# Patient Record
Sex: Male | Born: 1955 | Race: Black or African American | Hispanic: No | Marital: Married | Smoking: Current every day smoker
Health system: Southern US, Community
[De-identification: ages and names within clinical notes are randomized; demographics above are authoritative.]

## PROBLEM LIST (undated history)

## (undated) ENCOUNTER — Emergency Department (HOSPITAL_COMMUNITY): Admission: EM | Payer: No Typology Code available for payment source

## (undated) DIAGNOSIS — F1910 Other psychoactive substance abuse, uncomplicated: Secondary | ICD-10-CM

## (undated) DIAGNOSIS — R51 Headache: Secondary | ICD-10-CM

## (undated) DIAGNOSIS — G4700 Insomnia, unspecified: Secondary | ICD-10-CM

## (undated) DIAGNOSIS — E119 Type 2 diabetes mellitus without complications: Secondary | ICD-10-CM

## (undated) DIAGNOSIS — E7800 Pure hypercholesterolemia, unspecified: Secondary | ICD-10-CM

## (undated) DIAGNOSIS — F4310 Post-traumatic stress disorder, unspecified: Secondary | ICD-10-CM

## (undated) DIAGNOSIS — R519 Headache, unspecified: Secondary | ICD-10-CM

## (undated) DIAGNOSIS — K219 Gastro-esophageal reflux disease without esophagitis: Secondary | ICD-10-CM

## (undated) DIAGNOSIS — F419 Anxiety disorder, unspecified: Secondary | ICD-10-CM

## (undated) DIAGNOSIS — G43909 Migraine, unspecified, not intractable, without status migrainosus: Secondary | ICD-10-CM

## (undated) DIAGNOSIS — I10 Essential (primary) hypertension: Secondary | ICD-10-CM

## (undated) DIAGNOSIS — F32A Depression, unspecified: Secondary | ICD-10-CM

## (undated) DIAGNOSIS — H9190 Unspecified hearing loss, unspecified ear: Secondary | ICD-10-CM

## (undated) DIAGNOSIS — F329 Major depressive disorder, single episode, unspecified: Secondary | ICD-10-CM

## (undated) DIAGNOSIS — G4730 Sleep apnea, unspecified: Secondary | ICD-10-CM

## (undated) HISTORY — DX: Anxiety disorder, unspecified: F41.9

## (undated) HISTORY — DX: Depression, unspecified: F32.A

## (undated) HISTORY — DX: Headache, unspecified: R51.9

## (undated) HISTORY — DX: Major depressive disorder, single episode, unspecified: F32.9

## (undated) HISTORY — PX: COLON SURGERY: SHX602

## (undated) HISTORY — DX: Insomnia, unspecified: G47.00

## (undated) HISTORY — DX: Sleep apnea, unspecified: G47.30

## (undated) HISTORY — DX: Post-traumatic stress disorder, unspecified: F43.10

## (undated) HISTORY — DX: Type 2 diabetes mellitus without complications: E11.9

## (undated) HISTORY — DX: Migraine, unspecified, not intractable, without status migrainosus: G43.909

## (undated) HISTORY — PX: BRAIN SURGERY: SHX531

## (undated) HISTORY — DX: Headache: R51

## (undated) HISTORY — DX: Unspecified hearing loss, unspecified ear: H91.90

## (undated) HISTORY — PX: COLONOSCOPY: SHX174

## (undated) HISTORY — DX: Other psychoactive substance abuse, uncomplicated: F19.10

---

## 2002-11-05 ENCOUNTER — Emergency Department (HOSPITAL_COMMUNITY): Admission: EM | Admit: 2002-11-05 | Discharge: 2002-11-05 | Payer: Self-pay | Admitting: Emergency Medicine

## 2002-11-05 ENCOUNTER — Encounter: Payer: Self-pay | Admitting: Emergency Medicine

## 2004-08-13 ENCOUNTER — Ambulatory Visit: Payer: Self-pay | Admitting: Internal Medicine

## 2004-08-18 ENCOUNTER — Ambulatory Visit: Payer: Self-pay | Admitting: Family Medicine

## 2004-09-05 ENCOUNTER — Ambulatory Visit: Payer: Self-pay | Admitting: Gastroenterology

## 2004-09-14 DIAGNOSIS — Z86010 Personal history of colonic polyps: Secondary | ICD-10-CM | POA: Insufficient documentation

## 2004-09-19 ENCOUNTER — Ambulatory Visit: Payer: Self-pay | Admitting: Gastroenterology

## 2005-11-23 ENCOUNTER — Ambulatory Visit: Payer: Self-pay | Admitting: Internal Medicine

## 2006-01-15 ENCOUNTER — Ambulatory Visit: Payer: Self-pay | Admitting: Internal Medicine

## 2015-07-02 ENCOUNTER — Emergency Department (INDEPENDENT_AMBULATORY_CARE_PROVIDER_SITE_OTHER)
Admission: EM | Admit: 2015-07-02 | Discharge: 2015-07-02 | Disposition: A | Discharge status: Home / Self Care | Payer: Self-pay | Source: Home / Self Care | Attending: Emergency Medicine | Admitting: Emergency Medicine

## 2015-07-02 ENCOUNTER — Encounter (HOSPITAL_COMMUNITY): Payer: Self-pay | Admitting: Emergency Medicine

## 2015-07-02 DIAGNOSIS — K1120 Sialoadenitis, unspecified: Secondary | ICD-10-CM

## 2015-07-02 DIAGNOSIS — S0083XA Contusion of other part of head, initial encounter: Secondary | ICD-10-CM

## 2015-07-02 DIAGNOSIS — T148 Other injury of unspecified body region: Secondary | ICD-10-CM

## 2015-07-02 DIAGNOSIS — T148XXA Other injury of unspecified body region, initial encounter: Secondary | ICD-10-CM

## 2015-07-02 DIAGNOSIS — M545 Low back pain, unspecified: Secondary | ICD-10-CM

## 2015-07-02 DIAGNOSIS — R51 Headache: Secondary | ICD-10-CM

## 2015-07-02 DIAGNOSIS — R519 Headache, unspecified: Secondary | ICD-10-CM

## 2015-07-02 DIAGNOSIS — S39012A Strain of muscle, fascia and tendon of lower back, initial encounter: Secondary | ICD-10-CM

## 2015-07-02 MED ORDER — AMOXICILLIN-POT CLAVULANATE 875-125 MG PO TABS: 1.0000 | ORAL_TABLET | Freq: Two times a day (BID) | ORAL | Status: DC

## 2015-07-02 MED ORDER — NAPROXEN 375 MG PO TABS: 375.0000 mg | ORAL_TABLET | Freq: Two times a day (BID) | ORAL | Status: DC
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
## 2015-10-05 ENCOUNTER — Emergency Department (INDEPENDENT_AMBULATORY_CARE_PROVIDER_SITE_OTHER)
Admission: EM | Admit: 2015-10-05 | Discharge: 2015-10-05 | Disposition: A | Discharge status: Home / Self Care | Payer: Self-pay | Source: Home / Self Care

## 2015-10-05 ENCOUNTER — Encounter (HOSPITAL_COMMUNITY): Payer: Self-pay | Admitting: *Deleted

## 2015-10-05 DIAGNOSIS — K0889 Other specified disorders of teeth and supporting structures: Secondary | ICD-10-CM

## 2015-10-05 MED ORDER — KETOROLAC TROMETHAMINE 60 MG/2ML IM SOLN
INTRAMUSCULAR | Status: AC
  Filled 2015-10-05: qty 2

## 2015-10-05 MED ORDER — KETOROLAC TROMETHAMINE 60 MG/2ML IM SOLN
60.0000 mg | Freq: Once | INTRAMUSCULAR | Status: AC
  Administered 2015-10-05: 60 mg via INTRAMUSCULAR

## 2015-10-05 MED ORDER — OXYCODONE-ACETAMINOPHEN 5-325 MG PO TABS: 1.0000 | ORAL_TABLET | ORAL | Status: DC | PRN

## 2015-10-05 MED ORDER — AMOXICILLIN-POT CLAVULANATE 875-125 MG PO TABS: 1.0000 | ORAL_TABLET | Freq: Two times a day (BID) | ORAL | Status: DC

## 2015-10-05 NOTE — Discharge Instructions (Signed)
An injection of toradol (ketorolac, an anti inflammatory) was given today for pain. Followup with dentist as soon as possible about pain in jaw after recent tooth extractions. Prescriptions for augmentin (antibiotic) and a small number (#10) of percocet were given today.

## 2015-10-05 NOTE — ED Provider Notes (Signed)
CSN: [id number]     Arrival date & time 10/05/15  1916 History   None    Chief Complaint  Patient presents with  . Jaw Pain   HPI  Patient is a 60 year old gentleman who had left upper and left lower dental extractions about a week ago, now persistent pain in L upper jaw. No fever.    History reviewed. No pertinent past medical history. History reviewed. No pertinent past surgical history. History reviewed. No pertinent family history. Social History  Substance Use Topics  . Smoking status: Current Every Day Smoker    Types: Cigarettes  . Smokeless tobacco: None  . Alcohol Use: No    Review of Systems  All other systems reviewed and are negative.   Allergies  Review of patient's allergies indicates no known allergies.  Home Medications   Prior to Admission medications   Medication Sig Start Date End Date Taking? Authorizing Provider  amoxicillin-clavulanate (AUGMENTIN) 875-125 MG tablet Take 1 tablet by mouth every 12 (twelve) hours. 10/05/15   Eustace Moore, MD  naproxen (NAPROSYN) 375 MG tablet Take 1 tablet (375 mg total) by mouth 2 (two) times daily. 07/02/15   Hayden Rasmussen, NP  oxyCODONE-acetaminophen (PERCOCET/ROXICET) 5-325 MG tablet Take 1 tablet by mouth every 4 (four) hours as needed for severe pain. 10/05/15   Eustace Moore, MD   Meds Ordered and Administered this Visit   Medications  ketorolac (TORADOL) injection 60 mg (60 mg Intramuscular Given 10/05/15 2020)    BP 134/96 mmHg  Pulse 108  Temp(Src) 99.6 F (37.6 C) (Oral)  SpO2 99%   Physical Exam  Constitutional: He is oriented to person, place, and time. No distress.  Alert, nicely groomed  HENT:  Head: Atraumatic.  Extraction sites L upper molar: gums slightly swollen, no drainage; L lower molar, non inflamed gums.  No trismus. Increased pain after opening/closing mouth.  Face is not swollen.  No tenderness at/behind angle of jaw.    Eyes:  Conjugate gaze, no eye redness/drainage  Neck: Neck  supple.  Cardiovascular: Normal rate.   Pulmonary/Chest: No respiratory distress.  Abdominal: He exhibits no distension.  Musculoskeletal: Normal range of motion.  Neurological: He is alert and oriented to person, place, and time.  Skin: Skin is warm and dry.  No cyanosis  Nursing note and vitals reviewed.   ED Course  Procedures (including critical care time)   none   MDM   1. Pain, dental    Discharge Medication List as of 10/05/2015  8:20 PM    START taking these medications   Details  oxyCODONE-acetaminophen (PERCOCET/ROXICET) 5-325 MG tablet Take 1 tablet by mouth every 4 (four) hours as needed for severe pain., Starting 10/05/2015, Until Discontinued, Print       Rx for augmentin also given. Followup dentist asap.   Eustace Moore, MD 10/06/15 [phone]

## 2015-10-05 NOTE — ED Notes (Signed)
Pt  Reports  Symptoms  Of  l upper jaw  Pain         For  About  1   Week      Pt  Reports   Ad  2  Teeth  Extracted   8  Days  Ago        He  Reports  Pain when he  Opens  His  Mouth  And   On palpation

## 2016-06-08 ENCOUNTER — Ambulatory Visit (HOSPITAL_COMMUNITY)
Admission: EM | Admit: 2016-06-08 | Discharge: 2016-06-08 | Disposition: A | Discharge status: Home / Self Care | Payer: Self-pay | Attending: Family Medicine | Admitting: Family Medicine

## 2016-06-08 ENCOUNTER — Encounter (HOSPITAL_COMMUNITY): Payer: Self-pay | Admitting: Emergency Medicine

## 2016-06-08 DIAGNOSIS — M26622 Arthralgia of left temporomandibular joint: Secondary | ICD-10-CM

## 2016-06-08 HISTORY — DX: Gastro-esophageal reflux disease without esophagitis: K21.9

## 2016-06-08 HISTORY — DX: Pure hypercholesterolemia, unspecified: E78.00

## 2016-06-08 HISTORY — DX: Essential (primary) hypertension: I10

## 2016-06-08 MED ORDER — OXYCODONE-ACETAMINOPHEN 10-325 MG PO TABS: 1.0000 | ORAL_TABLET | ORAL | 0 refills | Status: DC | PRN

## 2016-06-08 NOTE — Discharge Instructions (Signed)
You may need to have a bite block built by a dentist or oral surgeon.   You may also want to use heat or cold to your jaw to help with pain.

## 2016-06-08 NOTE — ED Provider Notes (Signed)
CSN: [id number]     Arrival date & time 06/08/16  1535 History   First MD Initiated Contact with Patient 06/08/16 1635     Chief Complaint  Patient presents with  . Dental Pain   (Consider location/radiation/quality/duration/timing/severity/associated sxs/prior Treatment) HPI History obtained from patient:  Pt presents with the cc of:  Mouth pain Duration of symptoms: Ongoing for over a year Treatment prior to arrival: Has seen multiple providers for this pain but has not come to any good cause of pain. Context: Onset of pain that was described as spasm in his left jaw that comes and goes without warning. Patient states that he has been to see a dentist, urgent care, his doctor at the [place] and no one has ever given him a diagnosis. Other symptoms include: Pain to eat. Pain score: 8-10 FAMILY HISTORY: Hypertension    Past Medical History:  Diagnosis Date  . GERD (gastroesophageal reflux disease)   . Hypercholesteremia   . Hypertension    History reviewed. No pertinent surgical history. History reviewed. No pertinent family history. Social History  Substance Use Topics  . Smoking status: Current Every Day Smoker    Types: Cigarettes  . Smokeless tobacco: Never Used  . Alcohol use No    Review of Systems  Denies: HEADACHE, NAUSEA, ABDOMINAL PAIN, CHEST PAIN, CONGESTION, DYSURIA, SHORTNESS OF BREATH  Allergies  Review of patient's allergies indicates no known allergies.  Home Medications   Prior to Admission medications   Medication Sig Start Date End Date Taking? Authorizing Provider  aspirin EC 81 MG tablet Take 81 mg by mouth daily.   Yes Historical Provider, MD  atorvastatin (LIPITOR) 20 MG tablet Take 20 mg by mouth daily.   Yes Historical Provider, MD  buPROPion (ZYBAN) 150 MG 12 hr tablet Take 150 mg by mouth 2 (two) times daily.   Yes Historical Provider, MD  gabapentin (NEURONTIN) 300 MG capsule Take 300 mg by mouth 2 (two) times daily.   Yes Historical Provider,  MD  hydrochlorothiazide (HYDRODIURIL) 25 MG tablet Take 25 mg by mouth daily.   Yes Historical Provider, MD  lidocaine (XYLOCAINE) 2 % solution Use as directed 20 mLs in the mouth or throat as needed for mouth pain.   Yes Historical Provider, MD  lisinopril (PRINIVIL,ZESTRIL) 40 MG tablet Take 40 mg by mouth daily.   Yes Historical Provider, MD  loratadine (CLARITIN REDITABS) 10 MG dissolvable tablet Take 10 mg by mouth daily.   Yes Historical Provider, MD  mirtazapine (REMERON) 30 MG tablet Take 30 mg by mouth at bedtime.   Yes Historical Provider, MD  nicotine polacrilex (NICORETTE) 4 MG gum Take 4 mg by mouth as needed for smoking cessation.   Yes Historical Provider, MD  nortriptyline (PAMELOR) 25 MG capsule Take 25 mg by mouth at bedtime.   Yes Historical Provider, MD  omeprazole (PRILOSEC) 20 MG capsule Take 20 mg by mouth daily.   Yes Historical Provider, MD  Sennosides 8.6 MG CAPS Take by mouth.   Yes Historical Provider, MD  amoxicillin-clavulanate (AUGMENTIN) 875-125 MG tablet Take 1 tablet by mouth every 12 (twelve) hours. 10/05/15   Eustace Moore, MD  naproxen (NAPROSYN) 375 MG tablet Take 1 tablet (375 mg total) by mouth 2 (two) times daily. 07/02/15   Hayden Rasmussen, NP  oxyCODONE-acetaminophen (PERCOCET) 10-325 MG tablet Take 1 tablet by mouth every 4 (four) hours as needed for pain. 06/08/16   Tharon Aquas, PA  oxyCODONE-acetaminophen (PERCOCET/ROXICET) 5-325 MG tablet Take 1  tablet by mouth every 4 (four) hours as needed for severe pain. 10/05/15   Eustace MooreLaura W Murray, MD   Meds Ordered and Administered this Visit  Medications - No data to display  BP 128/95 (BP Location: Left Arm)   Pulse 75   Temp 98.7 F (37.1 C) (Oral)   Resp 12   SpO2 100%  No data found.   Physical Exam NURSES NOTES AND VITAL SIGNS REVIEWED. CONSTITUTIONAL: Well developed, well nourished, no acute distress HEENT: normocephalic, atraumatic, there is tenderness noted over the left TMJ. There is clicking  noted. The TMJ does not feel dislocated. Examination of the mouth reveals teeth in poor repair. EYES: Conjunctiva normal NECK:normal ROM, supple, no adenopathy PULMONARY:No respiratory distress, normal effort ABDOMINAL: Soft, ND, NT BS+, No CVAT MUSCULOSKELETAL: Normal ROM of all extremities,  SKIN: warm and dry without rash PSYCHIATRIC: Mood and affect, behavior are normal  Urgent Care Course   Clinical Course    Procedures (including critical care time)  Labs Review Labs Reviewed - No data to display  Imaging Review No results found.   Visual Acuity Review  Right Eye Distance:   Left Eye Distance:   Bilateral Distance:    Right Eye Near:   Left Eye Near:    Bilateral Near:         MDM   1. Arthralgia of left temporomandibular joint     Patient is reassured that there are no issues that require transfer to higher level of care at this time or additional tests. Patient is advised to continue home symptomatic treatment. Patient is advised that if there are new or worsening symptoms to attend the emergency department, contact primary care provider, or return to UC. Instructions of care provided discharged home in stable condition.    THIS NOTE WAS GENERATED USING A VOICE RECOGNITION SOFTWARE PROGRAM. ALL REASONABLE EFFORTS  WERE MADE TO PROOFREAD THIS DOCUMENT FOR ACCURACY.  I have verbally reviewed the discharge instructions with the patient. A printed AVS was given to the patient.  All questions were answered prior to discharge.      Tharon AquasFrank C Symphani Eckstrom, PA 06/08/16 1754

## 2016-06-08 NOTE — ED Triage Notes (Signed)
[facility] with a complaint of pain to the top pallet of his mouth x 1 week.

## 2017-01-26 ENCOUNTER — Ambulatory Visit: Payer: Self-pay | Admitting: Diagnostic Neuroimaging

## 2017-01-27 ENCOUNTER — Encounter: Payer: Self-pay | Admitting: Diagnostic Neuroimaging

## 2017-02-02 ENCOUNTER — Encounter (INDEPENDENT_AMBULATORY_CARE_PROVIDER_SITE_OTHER): Payer: Self-pay

## 2017-02-02 ENCOUNTER — Encounter: Payer: Self-pay | Admitting: Neurology

## 2017-02-02 ENCOUNTER — Ambulatory Visit (INDEPENDENT_AMBULATORY_CARE_PROVIDER_SITE_OTHER): Payer: Self-pay | Admitting: Neurology

## 2017-02-02 DIAGNOSIS — G501 Atypical facial pain: Secondary | ICD-10-CM

## 2017-02-02 MED ORDER — NORTRIPTYLINE HCL 25 MG PO CAPS
75.0000 mg | ORAL_CAPSULE | Freq: Every day | ORAL | 11 refills | Status: DC
Start: 2017-02-02 — End: 2022-01-28

## 2017-02-02 MED ORDER — GABAPENTIN 300 MG PO CAPS: 600.0000 mg | ORAL_CAPSULE | Freq: Three times a day (TID) | ORAL | 6 refills | Status: DC

## 2017-02-02 NOTE — Progress Notes (Signed)
PATIENT: Reginald Burke DOB: 09/27/1955  Chief Complaint  Patient presents with  . Trigeminal Neuralgia    Reports left-sided facial pain present since November 2016.  Says the pain is severe and constant.  His symptoms have been unrelieved by Tylenol, nortriptyline, gabapentin or oxycodone.  He needs prescription printed so he can [facility] [place].  . Oral Surgery    Best, Viviann SpareSteven, DMD - referring MD  . PCP    Cleta AlbertsMulles, Corazon, MD     HISTORICAL  Reginald Coseginald L Tidmore 61 years old right-handed male, seen in refer by dentist Nelda MarseilleBest, Steven, DMD for left facial pain, [facility] [address] Marko St[place]ulles, [place], initial evaluation was on Feb 02 2017  He had a history of hypertension, hyperlipidemia, PTSD, is taking buPropion 150 mg twice a day, and nortriptyline 25 mg a day, he is now taking gabapentin 300 mg twice a day since beginning of 2018,  he had a history of colectomy due to multiple colon polyps in 2013, he is on disability due to PTSD.  Have reviewed and summarized referring note from his dentist Dr. Ellery PlunkBest, he had left maxillary second premolar and left lower molar extrated in November 2016, he had tooth pain prior to the extraction, few days after the extraction, he has developed different kind of pain, throbbing pain at the left upper and lower jaws, radiating from the center, which has been persistent since then, pains were intermittent, but very intense, stop him in the middle of the sentence, triggered by touching his left face, chewing, talking, sometimes spreading to the left upper mouth roof, he denies significant weight loss, over the past 2 years, he has tried different medications, this including over-the-counter Tylenol, ibuprofen, prescription oxycodone, without helping his pain, he was told at one time that he had TMJ,  Patient had multiple imaging study including left jaw area [facility] [place], per  patient there was no significant abnormality found  He is now taking gabapentin 300 mg at 8 AM, 8 PM, complains of mild sleepiness, which does help his pain some, but sometimes he has such significant frequent pain, like last night, despite gabapentin and nortriptyline 25 mg 2 tablets every night, complains of difficulty sleeping.  REVIEW OF SYSTEMS: Full 14 system review of systems performed and notable only for hearing loss, feeling hot, cold, joint pain, depression anxiety not enough sleep, decreased energy disinteresting activities, racing thoughts.  ALLERGIES: No Known Allergies  HOME MEDICATIONS: Current Outpatient Prescriptions  Medication Sig Dispense Refill  . aspirin EC 81 MG tablet Take 81 mg by mouth daily.    Marland Kitchen. atorvastatin (LIPITOR) 20 MG tablet Take 20 mg by mouth daily.    Marland Kitchen. buPROPion (ZYBAN) 150 MG 12 hr tablet Take 150 mg by mouth 2 (two) times daily.    Marland Kitchen. gabapentin (NEURONTIN) 300 MG capsule Take 300 mg by mouth 2 (two) times daily.    . hydrochlorothiazide (HYDRODIURIL) 25 MG tablet Take 25 mg by mouth daily.    Marland Kitchen. lidocaine (XYLOCAINE) 2 % solution Use as directed 20 mLs in the mouth or throat as needed for mouth pain.    Marland Kitchen. lisinopril (PRINIVIL,ZESTRIL) 40 MG tablet Take 40 mg by mouth daily.    Marland Kitchen. loratadine (CLARITIN REDITABS) 10 MG dissolvable tablet Take 10 mg by mouth daily.    . mirtazapine (REMERON) 30 MG tablet Take 30 mg by mouth at bedtime.    .Marland Kitchen  naproxen (NAPROSYN) 375 MG tablet Take 1 tablet (375 mg total) by mouth 2 (two) times daily. 20 tablet 0  . nortriptyline (PAMELOR) 25 MG capsule Take 25 mg by mouth at bedtime.    Marland Kitchen omeprazole (PRILOSEC) 20 MG capsule Take 20 mg by mouth daily.    . Sennosides 8.6 MG CAPS Take by mouth.     No current facility-administered medications for this visit.     PAST MEDICAL HISTORY: Past Medical History:  Diagnosis Date  . Anxiety and depression   . GERD (gastroesophageal reflux disease)   . Hypercholesteremia   .  Hypertension   . Left facial pain   . Migraine   . PTSD (post-traumatic stress disorder)     PAST SURGICAL HISTORY: Past Surgical History:  Procedure Laterality Date  . COLON SURGERY      FAMILY HISTORY: Family History  Problem Relation Age of Onset  . Kidney failure Mother   . Prostate cancer Father     SOCIAL HISTORY:  Social History   Social History  . Marital status: Married    Spouse name: N/A  . Number of children: 2  . Years of education: HS   Occupational History  . Disabled    Social History Main Topics  . Smoking status: Current Every Day Smoker    Packs/day: 0.50    Types: Cigarettes  . Smokeless tobacco: Never Used  . Alcohol use No  . Drug use: No  . Sexual activity: Not on file   Other Topics Concern  . Not on file   Social History Narrative   Lives at home with wife and two sons.   Right-handed.   3 cups caffeine per day.     PHYSICAL EXAM   Vitals:   02/02/17 1126  BP: (!) 139/109  Pulse: 94  Weight: 179 lb 12 oz (81.5 kg)  Height: 5\' 11"  (1.803 m)    Not recorded      Body mass index is 25.07 kg/m.  PHYSICAL EXAMNIATION:  Gen: NAD, conversant, well nourised, obese, well groomed                     Cardiovascular: Regular rate rhythm, no peripheral edema, warm, nontender. Eyes: Conjunctivae clear without exudates or hemorrhage Neck: Supple, no carotid bruits. Pulmonary: Clear to auscultation bilaterally   NEUROLOGICAL EXAM:  MENTAL STATUS: Speech:    Speech is normal; fluent and spontaneous with normal comprehension.  Cognition:     Orientation to time, place and person     Normal recent and remote memory     Normal Attention span and concentration     Normal Language, naming, repeating,spontaneous speech     Fund of knowledge   CRANIAL NERVES: CN II: Visual fields are full to confrontation. Fundoscopic exam is normal with sharp discs and no vascular changes. Pupils are round equal and briskly reactive to  light. CN III, IV, VI: extraocular movement are normal. No ptosis. CN V: Facial sensation is intact to pinprick in all 3 divisions bilaterally. Corneal responses are intact.  CN VII: Face is symmetric with normal eye closure and smile. CN VIII: Hearing is normal to rubbing fingers CN IX, X: Palate elevates symmetrically. Phonation is normal. CN XI: Head turning and shoulder shrug are intact CN XII: Tongue is midline with normal movements and no atrophy.  MOTOR: There is no pronator drift of out-stretched arms. Muscle bulk and tone are normal. Muscle strength is normal.  REFLEXES: Reflexes are 2+  and symmetric at the biceps, triceps, knees, and ankles. Plantar responses are flexor.  SENSORY: Intact to light touch, pinprick, positional sensation and vibratory sensation are intact in fingers and toes.  COORDINATION: Rapid alternating movements and fine finger movements are intact. There is no dysmetria on finger-to-nose and heel-knee-shin.    GAIT/STANCE: Posture is normal. Gait is steady with normal steps, base, arm swing, and turning. Heel and toe walking are normal. Tandem gait is normal.  Romberg is absent.   DIAGNOSTIC DATA (LABS, IMAGING, TESTING) - I reviewed patient records, labs, notes, testing and imaging myself where available.   ASSESSMENT AND PLAN  COBIE MARCOUX is a 61 y.o. male   Atypical left facial pain, involving left V2, V3 branches,  Get [facility]  Increase gabapentin to 300 mg 2 tablets 3 times a day, increase nortriptyline to 25 mg 3 tablets every night   Levert Feinstein, M.D. Ph.D.  [facility] [address] [place], [place] [address] Ph: [phone] Fax: [phone]  CC:  Nelda Marseille, DMD, Cleta Alberts, MD

## 2017-03-01 ENCOUNTER — Telehealth: Payer: Self-pay | Admitting: Neurology

## 2017-03-01 ENCOUNTER — Encounter: Payer: Self-pay | Admitting: Neurology

## 2017-03-01 ENCOUNTER — Ambulatory Visit (INDEPENDENT_AMBULATORY_CARE_PROVIDER_SITE_OTHER): Payer: Self-pay | Admitting: Neurology

## 2017-03-01 VITALS — BP 144/89 | HR 86 | Ht 71.0 in | Wt 185.0 lb

## 2017-03-01 DIAGNOSIS — G501 Atypical facial pain: Secondary | ICD-10-CM

## 2017-03-01 MED ORDER — OXCARBAZEPINE 150 MG PO TABS: 300.0000 mg | ORAL_TABLET | Freq: Two times a day (BID) | ORAL | 11 refills | Status: DC

## 2017-03-01 MED ORDER — PREGABALIN 150 MG PO CAPS: 150.0000 mg | ORAL_CAPSULE | Freq: Three times a day (TID) | ORAL | 5 refills | Status: DC

## 2017-03-01 NOTE — Progress Notes (Signed)
PATIENT: Reginald Burke DOB: 09/14/1955  Chief Complaint  Patient presents with  . Follow-up    Atypical facial pain follow up pt states he is having facial spams over his face     HISTORICAL  Reginald CosReginald L Wille 61 years old right-handed male, seen in refer by dentist Best, Viviann SpareSteven, DMD for left facial pain, [facility] [address] Marko St[place]ulles, [place], initial evaluation was on Feb 02 2017  He had a history of hypertension, hyperlipidemia, PTSD, is taking buPropion 150 mg twice a day, and nortriptyline 25 mg a day, he is now taking gabapentin 300 mg twice a day since beginning of 2018,  he had a history of colectomy due to multiple colon polyps in 2013, he is on disability due to PTSD.  Have reviewed and summarized referring note from his dentist Dr. Ellery PlunkBest, he had left maxillary second premolar and left lower molar extrated in November 2016, he had tooth pain prior to the extraction, few days after the extraction, he has developed different kind of pain, throbbing pain at the left upper and lower jaws, radiating from the center, which has been persistent since then, pains were intermittent, but very intense, stop him in the middle of the sentence, triggered by touching his left face, chewing, talking, sometimes spreading to the left upper mouth roof, he denies significant weight loss, over the past 2 years, he has tried different medications, this including over-the-counter Tylenol, ibuprofen, prescription oxycodone, without helping his pain, he was told at one time that he had TMJ,  Patient had multiple imaging study [facility], per patient there was no significant abnormality found  He is now taking gabapentin 300 mg at 8 AM, 8 PM, complains of mild sleepiness, which does help his pain some, but sometimes he has such significant frequent pain, like last night, despite gabapentin and nortriptyline 25 mg 2 tablets every  night, complains of difficulty sleeping.  UPDATE March 01 2017: He is scheduled to have MRI of the brain with and without contrast [facility] on March 09 2017,  He is now taking gabapentin 300 mg 2 tablets 3 times a day, only help him mildly  He complains of severe intermittent left facial pain, REVIEW OF SYSTEMS: Full 14 system review of systems performed and notable only for activity change, appetite change, fatigue, facial swelling, hearing loss, ringing ears, trouble swallowing, eye discharge, itching, redness, light sensitivity, blurry vision cough, insomnia, frequent awakening, daytime sleepiness, sleep talking, acting out of James, frequent urination, joints pain, back pain, muscle cramps, walking difficulty, neck pain, stiffness, memory loss, swallowing needing for note, dizziness, numbness, seizure speech difficulty, weakness, tremor, facial drooping, agitation behavior change confusion decreased concentration depression anxiety hyperactivity  ALLERGIES: No Known Allergies  HOME MEDICATIONS: Current Outpatient Prescriptions  Medication Sig Dispense Refill  . aspirin EC 81 MG tablet Take 81 mg by mouth daily.    Marland Kitchen. atorvastatin (LIPITOR) 20 MG tablet Take 20 mg by mouth daily.    Marland Kitchen. buPROPion (ZYBAN) 150 MG 12 hr tablet Take 150 mg by mouth 2 (two) times daily.    Marland Kitchen. gabapentin (NEURONTIN) 300 MG capsule Take 2 capsules (600 mg total) by mouth 3 (three) times daily. 180 capsule 6  . hydrochlorothiazide (HYDRODIURIL) 25 MG tablet Take 25 mg by mouth daily.    Marland Kitchen. lidocaine (XYLOCAINE) 2 % solution Use as directed 20 mLs in the mouth or throat as  needed for mouth pain.    Marland Kitchen lisinopril (PRINIVIL,ZESTRIL) 40 MG tablet Take 40 mg by mouth daily.    Marland Kitchen loratadine (CLARITIN REDITABS) 10 MG dissolvable tablet Take 10 mg by mouth daily.    . mirtazapine (REMERON) 30 MG tablet Take 30 mg by mouth at bedtime.    . naproxen (NAPROSYN) 375 MG tablet Take 1 tablet (375 mg total) by mouth 2 (two)  times daily. 20 tablet 0  . nortriptyline (PAMELOR) 25 MG capsule Take 3 capsules (75 mg total) by mouth at bedtime. 90 capsule 11  . omeprazole (PRILOSEC) 20 MG capsule Take 20 mg by mouth daily.    . Sennosides 8.6 MG CAPS Take by mouth.     No current facility-administered medications for this visit.     PAST MEDICAL HISTORY: Past Medical History:  Diagnosis Date  . Anxiety and depression   . GERD (gastroesophageal reflux disease)   . Hypercholesteremia   . Hypertension   . Left facial pain   . Migraine   . PTSD (post-traumatic stress disorder)     PAST SURGICAL HISTORY: Past Surgical History:  Procedure Laterality Date  . COLON SURGERY      FAMILY HISTORY: Family History  Problem Relation Age of Onset  . Kidney failure Mother   . Prostate cancer Father     SOCIAL HISTORY:  Social History   Social History  . Marital status: Married    Spouse name: N/A  . Number of children: 2  . Years of education: HS   Occupational History  . Disabled    Social History Main Topics  . Smoking status: Current Every Day Smoker    Packs/day: 0.50    Types: Cigarettes  . Smokeless tobacco: Never Used  . Alcohol use No  . Drug use: No  . Sexual activity: Not on file   Other Topics Concern  . Not on file   Social History Narrative   Lives at home with wife and two sons.   Right-handed.   3 cups caffeine per day.     PHYSICAL EXAM   Vitals:   03/01/17 1143  BP: (!) 144/89  Pulse: 86  Weight: 185 lb (83.9 kg)  Height: 5\' 11"  (1.803 m)    Not recorded      Body mass index is 25.8 kg/m.  PHYSICAL EXAMNIATION:  Gen: NAD, conversant, well nourised, obese, well groomed                     Cardiovascular: Regular rate rhythm, no peripheral edema, warm, nontender. Eyes: Conjunctivae clear without exudates or hemorrhage Neck: Supple, no carotid bruits. Pulmonary: Clear to auscultation bilaterally   NEUROLOGICAL EXAM:  MENTAL STATUS: Speech:    Speech is  normal; fluent and spontaneous with normal comprehension.  Cognition:     Orientation to time, place and person     Normal recent and remote memory     Normal Attention span and concentration     Normal Language, naming, repeating,spontaneous speech     Fund of knowledge   CRANIAL NERVES: CN II: Visual fields are full to confrontation. Fundoscopic exam is normal with sharp discs and no vascular changes. Pupils are round equal and briskly reactive to light. CN III, IV, VI: extraocular movement are normal. No ptosis. CN V: Facial sensation is intact to pinprick in all 3 divisions bilaterally. Corneal responses are intact.  CN VII: Face is symmetric with normal eye closure and smile. CN VIII: Hearing is  normal to rubbing fingers CN IX, X: Palate elevates symmetrically. Phonation is normal. CN XI: Head turning and shoulder shrug are intact CN XII: Tongue is midline with normal movements and no atrophy.  MOTOR: There is no pronator drift of out-stretched arms. Muscle bulk and tone are normal. Muscle strength is normal.  REFLEXES: Reflexes are 2+ and symmetric at the biceps, triceps, knees, and ankles. Plantar responses are flexor.  SENSORY: Intact to light touch, pinprick, positional sensation and vibratory sensation are intact in fingers and toes.  COORDINATION: Rapid alternating movements and fine finger movements are intact. There is no dysmetria on finger-to-nose and heel-knee-shin.    GAIT/STANCE: Posture is normal. Gait is steady with normal steps, base, arm swing, and turning. Heel and toe walking are normal. Tandem gait is normal.  Romberg is absent.   DIAGNOSTIC DATA (LABS, IMAGING, TESTING) - I reviewed patient records, labs, notes, testing and imaging myself where available.   ASSESSMENT AND PLAN  DELSHAWN STECH is a 61 y.o. male   Atypical left facial pain, involving left V2, V3 branches,  Get medical [facility] from [facility]  MRI brain w/wo  pending March 09 2017 at [facility], will get CD to review  He failed to response to gabapentin 300 mg 2 tablets 3 times a day, continue 10 out of 10 pain, difficulty sleeping, eating  Add on Trileptal 150 mg 2 tablets twice a day,  Lyrica 150mg  tid, stop gabapentin    Levert Feinstein, M.D. Ph.D.  [facility] [address], [address] [place], [place] [address] Ph: [phone] Fax: [phone]  CC:  Nelda Marseille, DMD, Cleta Alberts, MD

## 2017-03-01 NOTE — Telephone Encounter (Signed)
Please get medical record from [facility], including MRI of the brain scan that is scheduled on March 09 2017

## 2017-04-12 ENCOUNTER — Encounter (INDEPENDENT_AMBULATORY_CARE_PROVIDER_SITE_OTHER): Payer: Self-pay

## 2017-04-12 ENCOUNTER — Encounter: Payer: Self-pay | Admitting: Neurology

## 2017-04-12 ENCOUNTER — Ambulatory Visit (INDEPENDENT_AMBULATORY_CARE_PROVIDER_SITE_OTHER): Payer: Self-pay | Admitting: Neurology

## 2017-04-12 VITALS — BP 143/103 | HR 112 | Ht 71.0 in | Wt 186.0 lb

## 2017-04-12 DIAGNOSIS — G501 Atypical facial pain: Secondary | ICD-10-CM

## 2017-04-12 MED ORDER — PREGABALIN 300 MG PO CAPS: 300.0000 mg | ORAL_CAPSULE | Freq: Three times a day (TID) | ORAL | 5 refills | Status: DC

## 2017-04-12 MED ORDER — OXCARBAZEPINE 300 MG PO TABS: 300.0000 mg | ORAL_TABLET | Freq: Two times a day (BID) | ORAL | 11 refills | Status: DC

## 2017-04-12 NOTE — Progress Notes (Signed)
PATIENT: Reginald Burke DOB: 10/23/1955  Chief Complaint  Patient presents with  . Facial Pain    Feels Trileptal and Lyrica have been mildly helpful for his symptoms but he is still having significant pain.  He would like to review his MRI results.     HISTORICAL  Reginald Burke 61 years old right-handed male, seen in refer by dentist Best, Viviann SpareSteven, DMD for left facial pain, [facility] [address] Marko Stai[place]ulles, [place], initial evaluation was on Feb 02 2017  He had a history of hypertension, hyperlipidemia, PTSD, is taking buPropion 150 mg twice a day, and nortriptyline 25 mg a day, he is now taking gabapentin 300 mg twice a day since beginning of 2018,  he had a history of colectomy due to multiple colon polyps in 2013, he is on disability due to PTSD.  Have reviewed and summarized referring note from his dentist Dr. Ellery PlunkBest, he had left maxillary second premolar and left lower molar extrated in November 2016, he had tooth pain prior to the extraction, few days after the extraction, he has developed different kind of pain, throbbing pain at the left upper and lower jaws, radiating from the center, which has been persistent since then, pains were intermittent, but very intense, stop him in the middle of the sentence, triggered by touching his left face, chewing, talking, sometimes spreading to the left upper mouth roof, he denies significant weight loss, over the past 2 years, he has tried different medications, this including over-the-counter Tylenol, ibuprofen, prescription oxycodone, without helping his pain, he was told at one time that he had TMJ,  Patient had multiple imaging [facility], per patient there was no significant abnormality found  He is now taking gabapentin 300 mg at 8 AM, 8 PM, complains of mild sleepiness, which does help his pain some, but sometimes he has such significant frequent pain,  like last night, despite gabapentin and nortriptyline 25 mg 2 tablets every night, complains of difficulty sleeping.  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, there was a description of tortous right intradural vertebral artery, that contact and deform right bulb, and across the midline to the left to join left vertebral pot artery, basilar arteries also to the midline to the right, normal enhancement, He continue complains of significant pain despite taking Lyrica 150 mg 3 times a day, Trileptal 150 mg twice a day, he complains of worsening left jaw pain with chewing,  There was no significant side effect noticed, which did help his symptoms some,  REVIEW OF SYSTEMS: Full 14 system review of systems performed and notable only for as above  ALLERGIES: No Known Allergies  HOME MEDICATIONS: Current Outpatient Prescriptions  Medication Sig Dispense Refill  . aspirin EC 81 MG tablet Take 81 mg by mouth daily.    Marland Kitchen. atorvastatin (LIPITOR) 20 MG tablet Take 20 mg by mouth daily.    Marland Kitchen. buPROPion (ZYBAN) 150 MG 12 hr tablet Take 150 mg by mouth  2 (two) times daily.    . hydrochlorothiazide (HYDRODIURIL) 25 MG tablet Take 25 mg by mouth daily.    Marland Kitchen lidocaine (XYLOCAINE) 2 % solution Use as directed 20 mLs in the mouth or throat as needed for mouth pain.    Marland Kitchen lisinopril (PRINIVIL,ZESTRIL) 40 MG tablet Take 40 mg by mouth daily.    Marland Kitchen loratadine (CLARITIN REDITABS) 10 MG dissolvable tablet Take 10 mg by mouth daily.    . naproxen (NAPROSYN) 375 MG tablet Take 1 tablet (375 mg total) by  mouth 2 (two) times daily. 20 tablet 0  . nortriptyline (PAMELOR) 25 MG capsule Take 3 capsules (75 mg total) by mouth at bedtime. 90 capsule 11  . omeprazole (PRILOSEC) 20 MG capsule Take 20 mg by mouth daily.    . OXcarbazepine (TRILEPTAL) 150 MG tablet Take 2 tablets (300 mg total) by mouth 2 (two) times daily. 120 tablet 11  . pregabalin (LYRICA) 150 MG capsule Take 1 capsule (150 mg total) by mouth 3 (three) times daily. 90 capsule 5   No current facility-administered medications for this visit.     PAST MEDICAL HISTORY: Past Medical History:  Diagnosis Date  . Anxiety and depression   . GERD (gastroesophageal reflux disease)   . Hypercholesteremia   . Hypertension   . Left facial pain   . Migraine   . PTSD (post-traumatic stress disorder)     PAST SURGICAL HISTORY: Past Surgical History:  Procedure Laterality Date  . COLON SURGERY      FAMILY HISTORY: Family History  Problem Relation Age of Onset  . Kidney failure Mother   . Prostate cancer Father     SOCIAL HISTORY:  Social History   Social History  . Marital status: Married    Spouse name: N/A  . Number of children: 2  . Years of education: HS   Occupational History  . Disabled    Social History Main Topics  . Smoking status: Current Every Day Smoker    Packs/day: 0.50    Types: Cigarettes  . Smokeless tobacco: Never Used  . Alcohol use No  . Drug use: No  . Sexual activity: Not on file   Other Topics Concern  . Not on file   Social History Narrative   Lives at home with wife and two sons.   Right-handed.   3 cups caffeine per day.     PHYSICAL EXAM   Vitals:   04/12/17 1106  BP: (!) 143/103  Pulse: (!) 112  Weight: 186 lb (84.4 kg)  Height: 5\' 11"  (1.803 m)    Not recorded      Body mass index is 25.94 kg/m.  PHYSICAL EXAMNIATION:  Gen: NAD, conversant, well nourised, obese, well groomed                     Cardiovascular: Regular rate rhythm, no peripheral edema, warm,  nontender. Eyes: Conjunctivae clear without exudates or hemorrhage Neck: Supple, no carotid bruits. Pulmonary: Clear to auscultation bilaterally   NEUROLOGICAL EXAM:  MENTAL STATUS: Speech:    Speech is normal; fluent and spontaneous with normal comprehension.  Cognition:     Orientation to time, place and person     Normal recent and remote memory     Normal Attention span and concentration     Normal Language, naming, repeating,spontaneous speech     Fund of knowledge   CRANIAL NERVES: CN II: Visual fields are full to confrontation. Fundoscopic exam is normal with sharp  discs and no vascular changes. Pupils are round equal and briskly reactive to light. CN III, IV, VI: extraocular movement are normal. No ptosis. CN V: Facial sensation is intact to pinprick in all 3 divisions bilaterally. Corneal responses are intact.  CN VII: Face is symmetric with normal eye closure and smile. CN VIII: Hearing is normal to rubbing fingers CN IX, X: Palate elevates symmetrically. Phonation is normal. CN XI: Head turning and shoulder shrug are intact CN XII: Tongue is midline with normal movements and no atrophy.  MOTOR: There is no pronator drift of out-stretched arms. Muscle bulk and tone are normal. Muscle strength is normal.  REFLEXES: Reflexes are 2+ and symmetric at the biceps, triceps, knees, and ankles. Plantar responses are flexor.  SENSORY: Intact to light touch, pinprick, positional sensation and vibratory sensation are intact in fingers and toes.  COORDINATION: Rapid alternating movements and fine finger movements are intact. There is no dysmetria on finger-to-nose and heel-knee-shin.    GAIT/STANCE: Posture is normal. Gait is steady with normal steps, base, arm swing, and turning. Heel and toe walking are normal. Tandem gait is normal.  Romberg is absent.   DIAGNOSTIC DATA (LABS, IMAGING, TESTING) - I reviewed patient records, labs, notes, testing and imaging myself where  available.   ASSESSMENT AND PLAN  Reginald Burke is a 61 y.o. male   Atypical left facial pain, involving left V2, V3 branches,   [facility].  Partial response to Lyrica, Trileptal, titrating up dosage to Lyrica 300 mg 3 times a day, Trileptal 300 mg twice a day      Levert FeinsteinYijun Vernel Donlan, M.D. Ph.D.  [facility] [address], [address] [place], [place] [address] Ph: [phone] Fax: [phone]  CC:  Nelda MarseilleBest, Steven, DMD, Cleta AlbertsMulles, [place], MD

## 2017-06-23 DIAGNOSIS — G500 Trigeminal neuralgia: Secondary | ICD-10-CM | POA: Insufficient documentation

## 2017-07-12 NOTE — Progress Notes (Deleted)
[facility]  PATIENT: Reginald Burke DOB: 27-Sep-1955   REASON FOR VISIT: Follow-up for trigeminal neuralgia  HISTORY FROM:    HISTORY OF PRESENT ILLNESS:Reginald Burke 61 years old right-handed male, seen in refer by dentist Nelda Marseille, DMD for left facial pain, his primary care physician is at the [facility] [address] Marko Stai, [place], initial evaluation was on Feb 02 2017  He had a history of hypertension, hyperlipidemia, PTSD, is taking buPropion 150 mg twice a day, and nortriptyline 25 mg a day, he is now taking gabapentin 300 mg twice a day since beginning of 2018,  he had a history of colectomy due to multiple colon polyps in 2013, he is on disability due to PTSD.  Have reviewed and summarized referring note from his dentist Dr. Ellery Plunk, he had left maxillary second premolar and left lower molar extrated in November 2016, he had tooth pain prior to the extraction, few days after the extraction, he has developed different kind of pain, throbbing pain at the left upper and lower jaws, radiating from the center, which has been persistent since then, pains were intermittent, but very intense, stop him in the middle of the sentence, triggered by touching his left face, chewing, talking, sometimes spreading to the left upper mouth roof, he denies significant weight loss, over the past 2 years, he has tried different medications, this including over-the-counter Tylenol, ibuprofen, prescription oxycodone, without helping his pain, he was told at one time that he had TMJ,  Patient had multiple imaging study including left jaw area at a [facility] [place], per patient there was no significant abnormality found  He is now taking gabapentin 300 mg at 8 AM, 8 PM, complains of mild sleepiness, which does help his pain some, but sometimes he has such significant frequent pain, like last night, despite gabapentin and nortriptyline 25 mg 2 tablets every night, complains  of difficulty sleeping.  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, there was a description of tortous right intradural vertebral artery, that contact and deform right bulb, and across the midline to the left to join left vertebral pot artery, basilar arteries also to the midline to the right, normal enhancement, He continue complains of significant pain despite taking Lyrica 150 mg 3 times a day, Trileptal 150 mg twice a day, he complains of worsening left jaw pain with chewing,  There was no significant side effect noticed, which did help his symptoms some,   REVIEW OF SYSTEMS: Full 14 system review of systems performed and notable only for those listed, all others are neg:  Constitutional: neg  Cardiovascular: neg Ear/Nose/Throat: neg  Skin: neg Eyes: neg Respiratory: neg Gastroitestinal: neg  Hematology/Lymphatic: neg  Endocrine: neg Musculoskeletal:neg Allergy/Immunology: neg Neurological: neg Psychiatric: neg Sleep : neg   ALLERGIES: No Known Allergies  HOME MEDICATIONS: Outpatient Medications Prior to Visit  Medication Sig Dispense Refill  . aspirin EC 81 MG tablet Take 81 mg by mouth daily.    Marland Kitchen atorvastatin (LIPITOR) 20 MG tablet Take 20 mg by mouth daily.    Marland Kitchen  buPROPion (ZYBAN) 150 MG 12 hr tablet Take 150 mg by mouth 2 (two) times daily.    . hydrochlorothiazide (HYDRODIURIL) 25 MG tablet Take 25 mg by mouth daily.    Marland Kitchen. lidocaine (XYLOCAINE) 2 % solution Use as directed 20 mLs in the mouth or throat as needed for mouth pain.    Marland Kitchen. lisinopril  (PRINIVIL,ZESTRIL) 40 MG tablet Take 40 mg by mouth daily.    Marland Kitchen. loratadine (CLARITIN REDITABS) 10 MG dissolvable tablet Take 10 mg by mouth daily.    . naproxen (NAPROSYN) 375 MG tablet Take 1 tablet (375 mg total) by mouth 2 (two) times daily. 20 tablet 0  . nortriptyline (PAMELOR) 25 MG capsule Take 3 capsules (75 mg total) by mouth at bedtime. 90 capsule 11  . omeprazole (PRILOSEC) 20 MG capsule Take 20 mg by mouth daily.    . Oxcarbazepine (TRILEPTAL) 300 MG tablet Take 1 tablet (300 mg total) by mouth 2 (two) times daily. 60 tablet 11  . pregabalin (LYRICA) 300 MG capsule Take 1 capsule (300 mg total) by mouth 3 (three) times daily. 90 capsule 5   No facility-administered medications prior to visit.     PAST MEDICAL HISTORY: Past Medical History:  Diagnosis Date  . Anxiety and depression   . GERD (gastroesophageal reflux disease)   . Hypercholesteremia   . Hypertension   . Left facial pain   . Migraine   . PTSD (post-traumatic stress disorder)     PAST SURGICAL HISTORY: Past Surgical History:  Procedure Laterality Date  . COLON SURGERY      FAMILY HISTORY: Family History  Problem Relation Age of Onset  . Kidney failure Mother   . Prostate cancer Father     SOCIAL HISTORY: Social History   Social History  . Marital status: Married    Spouse name: N/A  . Number of children: 2  . Years of education: HS   Occupational History  . Disabled    Social History Main Topics  . Smoking status: Current Every Day Smoker    Packs/day: 0.50    Types: Cigarettes  . Smokeless tobacco: Never Used  . Alcohol use No  . Drug use: No  . Sexual activity: Not on file   Other Topics Concern  . Not on file   Social History Narrative   Lives at home with wife and two sons.   Right-handed.   3 cups caffeine per day.     PHYSICAL EXAM  There were no vitals filed for this visit. There is no height or weight on file to calculate BMI.  Generalized: Well developed, in no  acute distress  Head: normocephalic and atraumatic,. Oropharynx benign  Neck: Supple, no carotid bruits  Cardiac: Regular rate rhythm, no murmur  Musculoskeletal: No deformity   Neurological examination   Mentation: Alert oriented to time, place, history taking. Attention span and concentration appropriate. Recent and remote memory intact.  Follows all commands speech and language fluent.   Cranial nerve II-XII: Fundoscopic exam reveals sharp disc margins.Pupils were equal round reactive to light extraocular movements were full, visual field were full on confrontational test. Facial sensation and strength were normal. hearing was intact to finger rubbing bilaterally. Uvula tongue midline. head turning and shoulder shrug were normal and symmetric.Tongue protrusion into cheek strength was normal. Motor: normal bulk and tone, full strength in the BUE, BLE, fine finger movements normal, no pronator drift. No focal weakness Sensory: normal and symmetric to light touch, pinprick, and  Vibration,  proprioception  Coordination: finger-nose-finger, heel-to-shin bilaterally, no dysmetria Reflexes: Brachioradialis 2/2, biceps 2/2, triceps 2/2, patellar 2/2, Achilles 2/2, plantar responses were flexor bilaterally. Gait and Station: Rising up from seated position without assistance, normal stance,  moderate stride, good arm swing, smooth turning, able to perform tiptoe, and heel walking without difficulty. Tandem gait is steady  DIAGNOSTIC DATA (LABS, IMAGING, TESTING) - I reviewed patient records, labs, notes, testing and imaging myself where available.   ASSESSMENT AND PLAN  61 y.o. year old male  has a past medical history of Anxiety and depression; GERD (gastroesophageal reflux disease); Hypercholesteremia; Hypertension; Left facial pain; Migraine; and PTSD (post-traumatic stress disorder). here with ***  Reginald Burke is a 61 y.o. male   Atypical left facial pain, involving left V2, V3  branches,              MRI brain w/wo on March 09 2017 at [facility] showed no significant abnormalities.             Partial response to Lyrica, Trileptal, titrating up dosage to Lyrica 300 mg 3 times a day, Trileptal 300 mg twice a day                 Nilda Riggs, [facility], [facility], APRN  [facility] [address] [place], [place] [address] [phone]

## 2017-07-13 ENCOUNTER — Ambulatory Visit: Payer: Self-pay | Admitting: Nurse Practitioner

## 2017-07-14 ENCOUNTER — Encounter: Payer: Self-pay | Admitting: Nurse Practitioner

## 2017-11-26 ENCOUNTER — Ambulatory Visit (INDEPENDENT_AMBULATORY_CARE_PROVIDER_SITE_OTHER): Payer: Non-veteran care | Admitting: Podiatry

## 2017-11-26 VITALS — BP 159/111 | HR 85 | Ht 72.0 in | Wt 170.0 lb

## 2017-11-26 DIAGNOSIS — B351 Tinea unguium: Secondary | ICD-10-CM | POA: Diagnosis not present

## 2017-11-26 DIAGNOSIS — M79609 Pain in unspecified limb: Principal | ICD-10-CM

## 2017-11-26 DIAGNOSIS — M79676 Pain in unspecified toe(s): Secondary | ICD-10-CM

## 2017-12-11 NOTE — Progress Notes (Signed)
  Subjective:  Patient ID: Reginald Burke, male    DOB: 08/06/1956,  MRN: [id number]  Chief Complaint  Patient presents with  . Callouses    bottom of left foot, is statring to be painful  . Nail Problem    bilateral elongated thickened toenails - probable fungus   62 y.o. male presents with the above complaint.  Past Medical History:  Diagnosis Date  . Anxiety and depression   . GERD (gastroesophageal reflux disease)   . Hypercholesteremia   . Hypertension   . Left facial pain   . Migraine   . PTSD (post-traumatic stress disorder)    Past Surgical History:  Procedure Laterality Date  . COLON SURGERY      Current Outpatient Medications:  .  aspirin EC 81 MG tablet, Take 81 mg by mouth daily., Disp: , Rfl:  .  atorvastatin (LIPITOR) 20 MG tablet, Take 20 mg by mouth daily., Disp: , Rfl:  .  buPROPion (ZYBAN) 150 MG 12 hr tablet, Take 150 mg by mouth 2 (two) times daily., Disp: , Rfl:  .  hydrochlorothiazide (HYDRODIURIL) 25 MG tablet, Take 25 mg by mouth daily., Disp: , Rfl:  .  lidocaine (XYLOCAINE) 2 % solution, Use as directed 20 mLs in the mouth or throat as needed for mouth pain., Disp: , Rfl:  .  lisinopril (PRINIVIL,ZESTRIL) 40 MG tablet, Take 40 mg by mouth daily., Disp: , Rfl:  .  loratadine (CLARITIN REDITABS) 10 MG dissolvable tablet, Take 10 mg by mouth daily., Disp: , Rfl:  .  naproxen (NAPROSYN) 375 MG tablet, Take 1 tablet (375 mg total) by mouth 2 (two) times daily., Disp: 20 tablet, Rfl: 0 .  nortriptyline (PAMELOR) 25 MG capsule, Take 3 capsules (75 mg total) by mouth at bedtime., Disp: 90 capsule, Rfl: 11 .  omeprazole (PRILOSEC) 20 MG capsule, Take 20 mg by mouth daily., Disp: , Rfl:  .  Oxcarbazepine (TRILEPTAL) 300 MG tablet, Take 1 tablet (300 mg total) by mouth 2 (two) times daily., Disp: 60 tablet, Rfl: 11 .  pregabalin (LYRICA) 300 MG capsule, Take 1 capsule (300 mg total) by mouth 3 (three) times daily., Disp: 90 capsule, Rfl: 5  No Known  Allergies   Objective:   Vitals:   11/26/17 1054  BP: (!) 159/111  Pulse: 85   General AA&O x3. Normal mood and affect.  Vascular Pedal pulses palpable.  Neurologic Epicritic sensation grossly intact.  Dermatologic No open lesions. Skin normal texture and turgor. Toenails x 10 elongated, thickened, dystrophic.  Orthopedic: Pain to palpation about the toenails.   Assessment & Plan:  Patient was evaluated and treated and all questions answered.  Onychomycosis with pain  -Nails palliatively debrided as below. -Educated on self-care  Procedure: Nail Debridement Rationale: pain  Type of Debridement: manual, sharp debridement. Instrumentation: Nail nipper, rotary burr. Number of Nails: 10  No follow-ups on file.

## 2018-02-17 DIAGNOSIS — F4310 Post-traumatic stress disorder, unspecified: Secondary | ICD-10-CM | POA: Insufficient documentation

## 2018-02-17 DIAGNOSIS — K219 Gastro-esophageal reflux disease without esophagitis: Secondary | ICD-10-CM | POA: Insufficient documentation

## 2018-02-17 DIAGNOSIS — I10 Essential (primary) hypertension: Secondary | ICD-10-CM | POA: Insufficient documentation

## 2018-02-17 DIAGNOSIS — E785 Hyperlipidemia, unspecified: Secondary | ICD-10-CM | POA: Insufficient documentation

## 2018-03-31 ENCOUNTER — Telehealth: Payer: Self-pay | Admitting: Podiatry

## 2018-03-31 NOTE — Telephone Encounter (Signed)
Please fax medical records to [phone].

## 2019-06-19 ENCOUNTER — Ambulatory Visit (HOSPITAL_COMMUNITY)
Admission: EM | Admit: 2019-06-19 | Discharge: 2019-06-19 | Disposition: A | Discharge status: Home / Self Care | Payer: Non-veteran care | Attending: Family Medicine | Admitting: Family Medicine

## 2019-06-19 ENCOUNTER — Encounter (HOSPITAL_COMMUNITY): Payer: Self-pay

## 2019-06-19 ENCOUNTER — Other Ambulatory Visit: Payer: Self-pay

## 2019-06-19 ENCOUNTER — Ambulatory Visit (INDEPENDENT_AMBULATORY_CARE_PROVIDER_SITE_OTHER): Payer: Non-veteran care

## 2019-06-19 DIAGNOSIS — M5030 Other cervical disc degeneration, unspecified cervical region: Secondary | ICD-10-CM

## 2019-06-19 DIAGNOSIS — M5010 Cervical disc disorder with radiculopathy, unspecified cervical region: Secondary | ICD-10-CM

## 2019-06-19 MED ORDER — TIZANIDINE HCL 4 MG PO TABS: 4.0000 mg | ORAL_TABLET | Freq: Four times a day (QID) | ORAL | 0 refills | Status: DC | PRN

## 2019-06-19 MED ORDER — PREDNISONE 10 MG (21) PO TBPK: ORAL_TABLET | ORAL | 0 refills | Status: DC
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
## 2019-06-19 NOTE — Discharge Instructions (Addendum)
You x ray showed some degenerative changes in the neck that could be causing your symptoms  Prednisone taper to help with the pain and inflammation. Zanaflex for muscle relaxant.  Recommend MRI for continued problems.

## 2019-08-01 ENCOUNTER — Ambulatory Visit (INDEPENDENT_AMBULATORY_CARE_PROVIDER_SITE_OTHER): Payer: Non-veteran care

## 2019-08-01 ENCOUNTER — Encounter (HOSPITAL_COMMUNITY): Payer: Self-pay | Admitting: Emergency Medicine

## 2019-08-01 ENCOUNTER — Other Ambulatory Visit: Payer: Self-pay

## 2019-08-01 ENCOUNTER — Ambulatory Visit (HOSPITAL_COMMUNITY)
Admission: EM | Admit: 2019-08-01 | Discharge: 2019-08-01 | Disposition: A | Discharge status: Home / Self Care | Payer: Non-veteran care | Attending: Family Medicine | Admitting: Family Medicine

## 2019-08-01 DIAGNOSIS — M5441 Lumbago with sciatica, right side: Secondary | ICD-10-CM

## 2019-08-01 DIAGNOSIS — I10 Essential (primary) hypertension: Secondary | ICD-10-CM | POA: Diagnosis not present

## 2019-08-01 MED ORDER — PREDNISONE 10 MG (21) PO TBPK: ORAL_TABLET | ORAL | 0 refills | Status: DC

## 2019-08-01 MED ORDER — KETOROLAC TROMETHAMINE 30 MG/ML IJ SOLN
30.0000 mg | Freq: Once | INTRAMUSCULAR | Status: AC
  Administered 2019-08-01: 11:00:00 30 mg via INTRAMUSCULAR

## 2019-08-01 MED ORDER — KETOROLAC TROMETHAMINE 30 MG/ML IJ SOLN
INTRAMUSCULAR | Status: AC
  Filled 2019-08-01: qty 1

## 2019-08-01 MED ORDER — HYDROCODONE-ACETAMINOPHEN 5-325 MG PO TABS: 1.0000 | ORAL_TABLET | Freq: Four times a day (QID) | ORAL | 0 refills | Status: DC | PRN
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
## 2019-08-01 NOTE — Discharge Instructions (Addendum)
Your x-ray was normal This is most likely sciatic nerve pain.  We will start you on a prednisone taper over the next 6 days.  Take this with food. You can also use the muscle relaxer as needed Hydrocodone for severe pain as needed.  Recommend for any continued or worsening symptoms you need to see an orthopedic.
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
## 2019-08-11 ENCOUNTER — Other Ambulatory Visit: Payer: Self-pay

## 2019-08-11 ENCOUNTER — Emergency Department (HOSPITAL_COMMUNITY): Payer: No Typology Code available for payment source

## 2019-08-11 ENCOUNTER — Emergency Department (HOSPITAL_COMMUNITY)
Admission: EM | Admit: 2019-08-11 | Discharge: 2019-08-11 | Disposition: A | Discharge status: Home / Self Care | Payer: No Typology Code available for payment source | Attending: Emergency Medicine | Admitting: Emergency Medicine

## 2019-08-11 ENCOUNTER — Encounter (HOSPITAL_COMMUNITY): Payer: Self-pay

## 2019-08-11 DIAGNOSIS — Z79899 Other long term (current) drug therapy: Secondary | ICD-10-CM | POA: Diagnosis not present

## 2019-08-11 DIAGNOSIS — M5441 Lumbago with sciatica, right side: Secondary | ICD-10-CM | POA: Diagnosis not present

## 2019-08-11 DIAGNOSIS — I10 Essential (primary) hypertension: Secondary | ICD-10-CM | POA: Insufficient documentation

## 2019-08-11 DIAGNOSIS — M5431 Sciatica, right side: Secondary | ICD-10-CM

## 2019-08-11 DIAGNOSIS — M545 Low back pain: Secondary | ICD-10-CM | POA: Diagnosis present

## 2019-08-11 DIAGNOSIS — F17210 Nicotine dependence, cigarettes, uncomplicated: Secondary | ICD-10-CM | POA: Diagnosis not present

## 2019-08-11 DIAGNOSIS — Z7982 Long term (current) use of aspirin: Secondary | ICD-10-CM | POA: Insufficient documentation

## 2019-08-11 MED ORDER — KETOROLAC TROMETHAMINE 30 MG/ML IJ SOLN
30.0000 mg | Freq: Once | INTRAMUSCULAR | Status: AC
  Administered 2019-08-11: 13:00:00 30 mg via INTRAMUSCULAR
  Filled 2019-08-11: qty 1

## 2019-08-11 MED ORDER — HYDROCODONE-ACETAMINOPHEN 5-325 MG PO TABS: 1.0000 | ORAL_TABLET | Freq: Two times a day (BID) | ORAL | 0 refills | Status: DC | PRN

## 2019-08-11 MED ORDER — METHOCARBAMOL 750 MG PO TABS: 750.0000 mg | ORAL_TABLET | Freq: Every evening | ORAL | 0 refills | Status: AC | PRN

## 2019-08-11 MED ORDER — DIAZEPAM 2 MG PO TABS
2.0000 mg | ORAL_TABLET | Freq: Once | ORAL | Status: AC
  Administered 2019-08-11: 2 mg via ORAL
  Filled 2019-08-11: qty 1
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
## 2019-08-11 NOTE — Discharge Instructions (Addendum)
Prescription given for Norco and robaxin. Take medication as directed and do not operate machinery, drive a car, or work while taking this medication as it can make you drowsy.   Please follow up with your primary care provider within 5-7 days for re-evaluation of your symptoms. If you do not have a primary care provider, information for a healthcare clinic has been provided for you to make arrangements for follow up care. Please return to the emergency department for any new or worsening symptoms.

## 2019-08-11 NOTE — ED Notes (Signed)
An After Visit Summary was printed and given to the patient. Discharge instructions given and no further questions at this time. Pt ambulatory with cane at discharge, leaving with wife.
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
## 2019-08-18 ENCOUNTER — Other Ambulatory Visit: Payer: Self-pay

## 2019-08-18 ENCOUNTER — Encounter (HOSPITAL_COMMUNITY): Payer: Self-pay

## 2019-08-18 ENCOUNTER — Emergency Department (HOSPITAL_COMMUNITY)
Admission: EM | Admit: 2019-08-18 | Discharge: 2019-08-18 | Disposition: A | Discharge status: Home / Self Care | Payer: No Typology Code available for payment source | Attending: Emergency Medicine | Admitting: Emergency Medicine

## 2019-08-18 DIAGNOSIS — F17210 Nicotine dependence, cigarettes, uncomplicated: Secondary | ICD-10-CM | POA: Diagnosis not present

## 2019-08-18 DIAGNOSIS — Z79899 Other long term (current) drug therapy: Secondary | ICD-10-CM | POA: Diagnosis not present

## 2019-08-18 DIAGNOSIS — M5441 Lumbago with sciatica, right side: Secondary | ICD-10-CM | POA: Insufficient documentation

## 2019-08-18 DIAGNOSIS — Z7982 Long term (current) use of aspirin: Secondary | ICD-10-CM | POA: Diagnosis not present

## 2019-08-18 DIAGNOSIS — I10 Essential (primary) hypertension: Secondary | ICD-10-CM | POA: Diagnosis not present

## 2019-08-18 DIAGNOSIS — M545 Low back pain: Secondary | ICD-10-CM | POA: Diagnosis present

## 2019-08-18 DIAGNOSIS — M5431 Sciatica, right side: Secondary | ICD-10-CM

## 2019-08-18 MED ORDER — METHYLPREDNISOLONE SODIUM SUCC 125 MG IJ SOLR
125.0000 mg | Freq: Once | INTRAMUSCULAR | Status: AC
  Administered 2019-08-18: 125 mg via INTRAMUSCULAR
  Filled 2019-08-18: qty 2

## 2019-08-18 MED ORDER — GABAPENTIN 100 MG PO CAPS: 100.0000 mg | ORAL_CAPSULE | Freq: Three times a day (TID) | ORAL | 0 refills | Status: DC

## 2019-08-18 MED ORDER — PREDNISONE 10 MG PO TABS
40.0000 mg | ORAL_TABLET | Freq: Every day | ORAL | 0 refills | Status: AC
Start: 2019-08-18 — End: 2019-08-23

## 2019-08-18 MED ORDER — MORPHINE SULFATE (PF) 4 MG/ML IV SOLN
4.0000 mg | Freq: Once | INTRAVENOUS | Status: AC
  Administered 2019-08-18: 4 mg via INTRAMUSCULAR
  Filled 2019-08-18: qty 1

## 2019-08-18 NOTE — ED Provider Notes (Signed)
[facility]-EMERGENCY DEPT Provider Note   CSN: [id number] Arrival date & time: 08/18/19  1544     History   Chief Complaint Chief Complaint  Patient presents with  . Back Pain    HPI Reginald Burke is a 63 y.o. male.     Patient is a 63 year old gentleman with past medical history of hypertension, GERD, chronic back pain presented to the emergency department for right-sided back pain radiating into his lower leg.  Patient has been seen multiple times in the ED in the last month for the same.  He is also seeing a specialist and is scheduled for a MRI on Tuesday.  Patient has had a lumbar x-ray and hip CT scan here in the emergency department in the last month revealing degenerative changes.  Patient reports that the pain continues.  Reports that he is having lower back pain that radiates into his leg to his toe he had does have some tingling and numbness in his leg but denies any weakness, fever, IV drug use, saddle anesthesia, urinary retention or loss of control of bowel or bladder movements.  He is taking hydrocodone and Robaxin reports that this is not helping.  He requests a steroid shot.     Past Medical History:  Diagnosis Date  . Anxiety and depression   . GERD (gastroesophageal reflux disease)   . Hypercholesteremia   . Hypertension   . Left facial pain   . Migraine   . PTSD (post-traumatic stress disorder)     Patient Active Problem List   Diagnosis Date Noted  . 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 COLON SURGERY          Home Medications    Prior to Admission medications   Medication Sig Start Date End Date Taking? Authorizing Provider  aspirin EC 81 MG tablet Take 81 mg by mouth daily.    [provider]  atorvastatin (LIPITOR) 20 MG tablet Take 20 mg by mouth daily.    [provider]  buPROPion (ZYBAN) 150 MG 12 hr tablet Take 150 mg by mouth 2 (two) times  daily.    [provider]  gabapentin (NEURONTIN) 100 MG capsule Take 1 capsule (100 mg total) by mouth 3 (three) times daily for 7 days. 08/18/19 08/25/19  Arlyn Dunning, PA-C  hydrochlorothiazide (HYDRODIURIL) 25 MG tablet Take 25 mg by mouth daily.    [provider]  HYDROcodone-acetaminophen (NORCO/VICODIN) 5-325 MG tablet Take 1 tablet by mouth every 12 (twelve) hours as needed. 08/11/19   Couture, Cortni S, PA-C  lidocaine (XYLOCAINE) 2 % solution Use as directed 20 mLs in the mouth or throat as needed for mouth pain.    [provider]  lisinopril (PRINIVIL,ZESTRIL) 40 MG tablet Take 40 mg by mouth daily.    [provider]  nortriptyline (PAMELOR) 25 MG capsule Take 3 capsules (75 mg total) by mouth at bedtime. 02/02/17   Levert Feinstein, MD  omeprazole (PRILOSEC) 20 MG capsule Take 20 mg by mouth daily.    [provider]  Oxcarbazepine (TRILEPTAL) 300 MG tablet Take 1 tablet (300 mg total) by mouth 2 (two) times daily. 04/12/17   Levert Feinstein, MD  predniSONE (DELTASONE) 10 MG tablet Take 4 tablets (40 mg total) by mouth daily for 5 days. 08/18/19 08/23/19  Ronnie Doss A, PA-C  loratadine (CLARITIN REDITABS) 10 MG dissolvable tablet Take 10 mg by mouth daily.  08/01/19  [provider]  pregabalin (LYRICA) 300 MG capsule Take 1 capsule (300 mg total) by mouth 3 (three) times daily. 04/12/17 08/01/19  Levert FeinsteinYan, Yijun, MD    Family History Family History  Problem Relation Age of Onset  . Kidney failure Mother   . Prostate cancer Father     Social History Social History   Tobacco Use  . Smoking status: Current Every Day Smoker    Packs/day: 0.50    Types: Cigarettes  . Smokeless tobacco: Never Used  Substance Use Topics  . Alcohol use: No  . Drug use: No     Allergies   Patient has no known allergies.   Review of Systems Review of Systems  Constitutional: Negative for chills and fever.  HENT: Negative for congestion.    Respiratory: Negative for shortness of breath.   Cardiovascular: Negative for chest pain and leg swelling.  Gastrointestinal: Negative for abdominal pain, nausea and vomiting.  Endocrine: Negative for polyuria.  Genitourinary: Negative for difficulty urinating, dysuria and urgency.  Musculoskeletal: Positive for arthralgias and back pain. Negative for joint swelling, myalgias, neck pain and neck stiffness.  Skin: Negative for rash.  Neurological: Negative for dizziness, syncope, weakness and numbness.     Physical Exam Updated Vital Signs BP (!) 131/101 (BP Location: Right Arm)   Pulse (!) 112   Temp 99.2 F (37.3 C) (Oral)   Resp 20   SpO2 98%   Physical Exam Vitals signs and nursing note reviewed.  Constitutional:      Appearance: Normal appearance.  HENT:     Head: Normocephalic.  Eyes:     Conjunctiva/sclera: Conjunctivae normal.  Cardiovascular:     Rate and Rhythm: Normal rate and regular rhythm.  Pulmonary:     Effort: Pulmonary effort is normal.  Musculoskeletal:     Right lower leg: No edema.     Left lower leg: No edema.     Comments: Normal bilateral strength, sensation, pulses.  Tenderness to palpation in the posterior hip and right lower lumbar spine.  No point bony tenderness.  Skin:    General: Skin is warm and dry.     Findings: No bruising or erythema.  Neurological:     General: No focal deficit present.     Mental Status: He is alert.     Sensory: No sensory deficit.     Gait: Gait abnormal (antalgic favoring right side).     Deep Tendon Reflexes: Reflexes normal.  Psychiatric:        Mood and Affect: Mood normal.      ED Treatments / Results  Labs (all labs ordered are listed, but only abnormal results are displayed) Labs Reviewed - No data to display  EKG None  Radiology No results found.  Procedures Procedures (including critical care time)  Medications Ordered in ED Medications  morphine 4 MG/ML injection 4 mg (4 mg  Intramuscular Given 08/18/19 1753)  methylPREDNISolone sodium succinate (SOLU-MEDROL) 125 mg/2 mL injection 125 mg (125 mg Intramuscular Given 08/18/19 1753)     Initial Impression / Assessment and Plan / ED Course  I have reviewed the triage vital signs and the nursing notes.  Pertinent labs & imaging results that were available during my care of the patient were reviewed by me and considered in my medical decision making (see chart for details).  Clinical Course as of Aug 17 1804  Fri Aug 18, 2019  1801 Patient was evaluated for back pain today. Patient has no concerning  symptoms or physical exam findings including no fever, no loss of control of bowel or bladder, no urinary retention, no saddle anesthesia, no leg weakness and no IVDU. He was given medication to treat his symptoms of sciatica and advised to f/u with PMD for further workup including possible PT, medication change, further imaging, etc. He does have f/u next week and will be getting MRI next week. No findings today to suggest need for emergent MRI today. He  was advised on all concerning symptoms above and to return to the ED if any of them arise.    [KM]    Clinical Course User Index [KM] Alveria Apley, PA-C       Based on review of vitals, medical screening exam, lab work and/or imaging, there does not appear to be an acute, emergent etiology for the patient's symptoms. Counseled pt on good return precautions and encouraged both PCP and ED follow-up as needed.  Prior to discharge, I also discussed incidental imaging findings with patient in detail and advised appropriate, recommended follow-up in detail.  Clinical Impression: 1. Sciatica of right side     Disposition: Discharge  Prior to providing a prescription for a controlled substance, I independently reviewed the patient's recent prescription history on the [facility]. The patient had no recent or regular prescriptions  and was deemed appropriate for a brief, less than 3 day prescription of narcotic for acute analgesia.  This note was prepared with assistance of Systems analyst. Occasional wrong-word or sound-a-like substitutions may have occurred due to the inherent limitations of voice recognition software.   Final Clinical Impressions(s) / ED Diagnoses   Final diagnoses:  Sciatica of right side    ED Discharge Orders         Ordered    gabapentin (NEURONTIN) 100 MG capsule  3 times daily     08/18/19 1804    predniSONE (DELTASONE) 10 MG tablet  Daily     08/18/19 1804           Kristine Royal 08/18/19 1805    Blanchie Dessert, MD 08/19/19 0111

## 2019-08-18 NOTE — Discharge Instructions (Addendum)
You are seen today for sciatica.  You have an appointment for an MRI next week.  Please keep this appointment and follow-up with the specialist for further treatment and workup.  Please only return to the emergency department if you have any new or worsening symptoms such as trouble going to the bathroom, fever, loss of control of bowel or bladder movement, new numbness or weakness in your legs.  Otherwise, take your medications as prescribed.  The medication as prescribed may cause dizziness, drowsiness or addiction even if taken as prescribed.  Please do not take while driving.
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
## 2019-09-04 ENCOUNTER — Other Ambulatory Visit: Payer: Self-pay

## 2019-09-04 ENCOUNTER — Ambulatory Visit (HOSPITAL_COMMUNITY)
Admission: EM | Admit: 2019-09-04 | Discharge: 2019-09-04 | Disposition: A | Discharge status: Home / Self Care | Payer: No Typology Code available for payment source | Attending: Family Medicine | Admitting: Family Medicine

## 2019-09-04 ENCOUNTER — Encounter (HOSPITAL_COMMUNITY): Payer: Self-pay | Admitting: Emergency Medicine

## 2019-09-04 DIAGNOSIS — M545 Low back pain, unspecified: Secondary | ICD-10-CM

## 2019-09-04 DIAGNOSIS — M5430 Sciatica, unspecified side: Secondary | ICD-10-CM

## 2019-09-04 DIAGNOSIS — G8929 Other chronic pain: Secondary | ICD-10-CM | POA: Diagnosis not present

## 2019-09-04 MED ORDER — METHYLPREDNISOLONE ACETATE 80 MG/ML IJ SUSP
80.0000 mg | Freq: Once | INTRAMUSCULAR | Status: AC
  Administered 2019-09-04: 16:00:00 80 mg via INTRAMUSCULAR

## 2019-09-04 MED ORDER — METHYLPREDNISOLONE ACETATE 80 MG/ML IJ SUSP
INTRAMUSCULAR | Status: AC
  Filled 2019-09-04: qty 1

## 2019-09-04 MED ORDER — DICLOFENAC SODIUM 75 MG PO TBEC: 75.0000 mg | DELAYED_RELEASE_TABLET | Freq: Two times a day (BID) | ORAL | 0 refills | Status: DC

## 2019-09-04 MED ORDER — HYDROCODONE-ACETAMINOPHEN 7.5-325 MG PO TABS: 1.0000 | ORAL_TABLET | Freq: Four times a day (QID) | ORAL | 0 refills | Status: DC | PRN

## 2019-09-04 MED ORDER — TIZANIDINE HCL 4 MG PO TABS: 4.0000 mg | ORAL_TABLET | Freq: Four times a day (QID) | ORAL | 0 refills | Status: DC | PRN

## 2019-09-04 NOTE — Discharge Instructions (Signed)
Activity as tolerated Take the diclofenac 2 x a day This is the anti inflammatory medicine Take the tizanidine as needed - muscle relaxer Take pain medication when pain severe Do not take pain medicine and drive Call y our PCP today for PT/Referral
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
## 2019-09-29 ENCOUNTER — Ambulatory Visit: Payer: No Typology Code available for payment source | Attending: Internal Medicine

## 2019-09-29 DIAGNOSIS — Z20822 Contact with and (suspected) exposure to covid-19: Secondary | ICD-10-CM

## 2019-09-30 LAB — NOVEL CORONAVIRUS, NAA: SARS-CoV-2, NAA: NOT DETECTED

## 2019-10-04 ENCOUNTER — Telehealth: Payer: Self-pay | Admitting: Hematology

## 2019-10-04 NOTE — Telephone Encounter (Signed)
Pt is aware covid 19 test is neg on 10-04-2019 

## 2019-12-11 ENCOUNTER — Ambulatory Visit: Payer: No Typology Code available for payment source | Attending: Internal Medicine

## 2019-12-11 DIAGNOSIS — Z23 Encounter for immunization: Secondary | ICD-10-CM

## 2019-12-11 NOTE — Progress Notes (Signed)
   Covid-19 Vaccination Clinic  Name:  KENZIE FLAKES    MRN: [id number] DOB: 1956-08-18  12/11/2019  Mr. Malczewski was observed post Covid-19 immunization for 15 minutes without incident. He was provided with Vaccine Information Sheet and instruction to access the V-Safe system.   Mr. Muzquiz was instructed to call 911 with any severe reactions post vaccine: Marland Kitchen Difficulty breathing  . Swelling of face and throat  . A fast heartbeat  . A bad rash all over body  . Dizziness and weakness   Immunizations Administered    Name Date Dose VIS Date Route   Pfizer COVID-19 Vaccine 12/11/2019 11:15 AM 0.3 mL 08/25/2019 Intramuscular   Manufacturer: [employer], [employer]   Lot: [id number]   NDC: [address]-9

## 2020-01-02 ENCOUNTER — Ambulatory Visit: Payer: No Typology Code available for payment source | Attending: Internal Medicine

## 2020-01-02 DIAGNOSIS — Z23 Encounter for immunization: Secondary | ICD-10-CM

## 2020-01-02 NOTE — Progress Notes (Signed)
   Covid-19 Vaccination Clinic  Name:  Reginald Burke    MRN: [id number] DOB: 03-May-1956  01/02/2020  [name] was observed post Covid-19 immunization for 15 minutes without incident. He was provided with Vaccine Information Sheet and instruction to access the V-Safe system.   [name] was instructed to call 911 with any severe reactions post vaccine: Marland Kitchen Difficulty breathing  . Swelling of face and throat  . A fast heartbeat  . A bad rash all over body  . Dizziness and weakness   Immunizations Administered    Name Date Dose VIS Date Route   Pfizer COVID-19 Vaccine 01/02/2020 12:49 PM 0.3 mL 11/08/2018 Intramuscular   Manufacturer: [employer], [employer]   Lot: [id number]   NDC: [address]-5
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
## 2021-03-16 ENCOUNTER — Emergency Department (HOSPITAL_COMMUNITY): Payer: No Typology Code available for payment source

## 2021-03-16 ENCOUNTER — Emergency Department (HOSPITAL_COMMUNITY)
Admission: EM | Admit: 2021-03-16 | Discharge: 2021-03-16 | Disposition: A | Discharge status: Home / Self Care | Payer: No Typology Code available for payment source | Attending: Emergency Medicine | Admitting: Emergency Medicine

## 2021-03-16 ENCOUNTER — Other Ambulatory Visit: Payer: Self-pay

## 2021-03-16 DIAGNOSIS — F17210 Nicotine dependence, cigarettes, uncomplicated: Secondary | ICD-10-CM | POA: Insufficient documentation

## 2021-03-16 DIAGNOSIS — I10 Essential (primary) hypertension: Secondary | ICD-10-CM | POA: Diagnosis not present

## 2021-03-16 DIAGNOSIS — K5732 Diverticulitis of large intestine without perforation or abscess without bleeding: Secondary | ICD-10-CM | POA: Diagnosis not present

## 2021-03-16 DIAGNOSIS — Z79899 Other long term (current) drug therapy: Secondary | ICD-10-CM | POA: Diagnosis not present

## 2021-03-16 DIAGNOSIS — K769 Liver disease, unspecified: Secondary | ICD-10-CM | POA: Diagnosis not present

## 2021-03-16 DIAGNOSIS — R39198 Other difficulties with micturition: Secondary | ICD-10-CM | POA: Diagnosis present

## 2021-03-16 DIAGNOSIS — Z7982 Long term (current) use of aspirin: Secondary | ICD-10-CM | POA: Insufficient documentation

## 2021-03-16 DIAGNOSIS — K5792 Diverticulitis of intestine, part unspecified, without perforation or abscess without bleeding: Secondary | ICD-10-CM

## 2021-03-16 LAB — CBC WITH DIFFERENTIAL/PLATELET
Abs Immature Granulocytes: 0.03 10*3/uL (ref 0.00–0.07)
Basophils Absolute: 0 10*3/uL (ref 0.0–0.1)
Basophils Relative: 0 %
Eosinophils Absolute: 0.1 10*3/uL (ref 0.0–0.5)
Eosinophils Relative: 1 %
HCT: 41.4 % (ref 39.0–52.0)
Hemoglobin: 14.1 g/dL (ref 13.0–17.0)
Immature Granulocytes: 0 %
Lymphocytes Relative: 29 %
Lymphs Abs: 2.9 10*3/uL (ref 0.7–4.0)
MCH: 29.2 pg (ref 26.0–34.0)
MCHC: 34.1 g/dL (ref 30.0–36.0)
MCV: 85.7 fL (ref 80.0–100.0)
Monocytes Absolute: 0.8 10*3/uL (ref 0.1–1.0)
Monocytes Relative: 8 %
Neutro Abs: 6 10*3/uL (ref 1.7–7.7)
Neutrophils Relative %: 62 %
Platelets: 253 10*3/uL (ref 150–400)
RBC: 4.83 MIL/uL (ref 4.22–5.81)
RDW: 13 % (ref 11.5–15.5)
WBC: 9.7 10*3/uL (ref 4.0–10.5)
nRBC: 0 % (ref 0.0–0.2)

## 2021-03-16 LAB — BASIC METABOLIC PANEL
Anion gap: 10 (ref 5–15)
BUN: 14 mg/dL (ref 8–23)
CO2: 26 mmol/L (ref 22–32)
Calcium: 9.5 mg/dL (ref 8.9–10.3)
Chloride: 102 mmol/L (ref 98–111)
Creatinine, Ser: 1.35 mg/dL — ABNORMAL HIGH (ref 0.61–1.24)
GFR, Estimated: 58 mL/min — ABNORMAL LOW (ref >60)
Glucose, Bld: 105 mg/dL — ABNORMAL HIGH (ref 70–99)
Potassium: 3.4 mmol/L — ABNORMAL LOW (ref 3.5–5.1)
Sodium: 138 mmol/L (ref 135–145)

## 2021-03-16 LAB — URINALYSIS, ROUTINE W REFLEX MICROSCOPIC
Bilirubin Urine: NEGATIVE
Glucose, UA: NEGATIVE mg/dL
Hgb urine dipstick: NEGATIVE
Ketones, ur: NEGATIVE mg/dL
Leukocytes,Ua: NEGATIVE
Nitrite: NEGATIVE
Protein, ur: NEGATIVE mg/dL
Specific Gravity, Urine: 1.014 (ref 1.005–1.030)
pH: 5 (ref 5.0–8.0)

## 2021-03-16 MED ORDER — CIPROFLOXACIN HCL 500 MG PO TABS
500.0000 mg | ORAL_TABLET | Freq: Once | ORAL | Status: AC
  Administered 2021-03-16: 500 mg via ORAL
  Filled 2021-03-16: qty 1

## 2021-03-16 MED ORDER — TAMSULOSIN HCL 0.4 MG PO CAPS: 0.4000 mg | ORAL_CAPSULE | Freq: Every day | ORAL | Status: DC

## 2021-03-16 MED ORDER — METRONIDAZOLE 500 MG PO TABS: 500.0000 mg | ORAL_TABLET | Freq: Three times a day (TID) | ORAL | 0 refills | Status: AC

## 2021-03-16 MED ORDER — CIPROFLOXACIN HCL 500 MG PO TABS: 500.0000 mg | ORAL_TABLET | Freq: Two times a day (BID) | ORAL | 0 refills | Status: DC

## 2021-03-16 MED ORDER — METRONIDAZOLE 500 MG PO TABS
500.0000 mg | ORAL_TABLET | Freq: Once | ORAL | Status: AC
  Administered 2021-03-16: 500 mg via ORAL
  Filled 2021-03-16: qty 1

## 2021-03-16 MED ORDER — TAMSULOSIN HCL 0.4 MG PO CAPS: 0.4000 mg | ORAL_CAPSULE | Freq: Every day | ORAL | 0 refills | Status: AC

## 2021-03-16 NOTE — ED Triage Notes (Signed)
Pt came in with c/o suprapubic abdominal pain. Pt states "I feel like something is pushing on my bladder". Pt states he has been urinating more than normal and feels like he has to go all the time. Pt states he dribbles when he does go. When asked if pt has enlarged prostates, he states "I think so".

## 2021-03-16 NOTE — Discharge Instructions (Addendum)
You were diagnosed with diverticulitis today.  This is inflammation of your colon.  You will need to complete a full week's course of the antibiotics that were prescribed.  Please take them as prescribed.  You should stick to a light diet for the next 2 days, mostly soups and broths and light food.  You can work your way back to a normal diet.  Please follow-up with your primary care doctor this week for another exam.  Please note that your CT scan showed that you had a very small lesion on your liver.  This is not a specific finding.  You will need to discuss this with your primary care doctor.  However the recommendation would be to have an outpatient MRI done of your liver to take a better look at this.

## 2021-03-16 NOTE — ED Provider Notes (Signed)
[facility]-EMERGENCY DEPT Provider Note   CSN: [id number] Arrival date & time: 03/16/21  3419     History CC:  Back pain, hip pain   Reginald NOTEBOOM is a 66 y.o. male presenting to the ED with difficulty urinating.  He reports onset of symptoms a few days ago.  He says "I feel like something is pressing on my bladder".  He said is a difficult time peeing, only for the past few days.  He is unaware of prostate problems.  He is not on Flomax.  He denies history of UTIs.  He follows at the [facility] hospital in [place].  He denies fevers or chills.  He denies nausea, vomiting, diarrhea constipation.  HPI     Past Medical History:  Diagnosis Date   Anxiety and depression    GERD (gastroesophageal reflux disease)    Hypercholesteremia    Hypertension    Left facial pain    Migraine    PTSD (post-traumatic stress disorder)     Patient Active Problem List   Diagnosis Date Noted   Atypical facial pain 02/02/2017    Past Surgical History:  Procedure Laterality Date   COLON SURGERY         Family History  Problem Relation Age of Onset   Kidney failure Mother    Prostate cancer Father     Social History   Tobacco Use   Smoking status: Every Day    Packs/day: 0.50    Pack years: 0.00    Types: Cigarettes   Smokeless tobacco: Never  Vaping Use   Vaping Use: Never used  Substance Use Topics   Alcohol use: No   Drug use: No    Home Medications Prior to Admission medications   Medication Sig Start Date End Date Taking? Authorizing Provider  ciprofloxacin (CIPRO) 500 MG tablet Take 1 tablet (500 mg total) by mouth every 12 (twelve) hours. 03/16/21  Yes Landyn Lorincz, Kermit Balo, MD  metroNIDAZOLE (FLAGYL) 500 MG tablet Take 1 tablet (500 mg total) by mouth 3 (three) times daily for 7 days. 03/16/21 03/23/21 Yes Terald Sleeper, MD  aspirin EC 81 MG tablet Take 81 mg by mouth daily.    [provider]  atorvastatin (LIPITOR) 20 MG tablet Take 20 mg  by mouth daily.    [provider]  diclofenac (VOLTAREN) 75 MG EC tablet Take 1 tablet (75 mg total) by mouth 2 (two) times daily. 09/04/19   Eustace Moore, MD  hydrochlorothiazide (HYDRODIURIL) 25 MG tablet Take 25 mg by mouth daily.    [provider]  HYDROcodone-acetaminophen (NORCO) 7.5-325 MG tablet Take 1 tablet by mouth every 6 (six) hours as needed for moderate pain. 09/04/19   Eustace Moore, MD  lidocaine (XYLOCAINE) 2 % solution Use as directed 20 mLs in the mouth or throat as needed for mouth pain.    [provider]  lisinopril (PRINIVIL,ZESTRIL) 40 MG tablet Take 40 mg by mouth daily.    [provider]  nortriptyline (PAMELOR) 25 MG capsule Take 3 capsules (75 mg total) by mouth at bedtime. 02/02/17   Levert Feinstein, MD  omeprazole (PRILOSEC) 20 MG capsule Take 20 mg by mouth daily.    [provider]  tamsulosin (FLOMAX) 0.4 MG CAPS capsule Take 1 capsule (0.4 mg total) by mouth daily after breakfast for 15 doses. 03/16/21 03/31/21  Terald Sleeper, MD  tiZANidine (ZANAFLEX) 4 MG tablet Take 1-2 tablets (4-8 mg total) by mouth  every 6 (six) hours as needed for muscle spasms. 09/04/19   Eustace MooreNelson, Yvonne Sue, MD  gabapentin (NEURONTIN) 100 MG capsule Take 1 capsule (100 mg total) by mouth 3 (three) times daily for 7 days. 08/18/19 09/04/19  Ronnie DossMcLean, Kelly A, PA-C  loratadine (CLARITIN REDITABS) 10 MG dissolvable tablet Take 10 mg by mouth daily.  08/01/19  [provider]  Oxcarbazepine (TRILEPTAL) 300 MG tablet Take 1 tablet (300 mg total) by mouth 2 (two) times daily. 04/12/17 09/04/19  Levert FeinsteinYan, Yijun, MD  pregabalin (LYRICA) 300 MG capsule Take 1 capsule (300 mg total) by mouth 3 (three) times daily. 04/12/17 08/01/19  Levert FeinsteinYan, Yijun, MD    Allergies    Patient has no known allergies.  Review of Systems   Review of Systems  Constitutional:  Negative for chills and fever.  Respiratory:  Negative for cough and shortness of breath.    Cardiovascular:  Negative for chest pain and palpitations.  Gastrointestinal:  Negative for abdominal pain, nausea and vomiting.  Genitourinary:  Positive for difficulty urinating. Negative for flank pain.  Musculoskeletal:  Negative for back pain and myalgias.  Skin:  Negative for color change and rash.  Neurological:  Negative for syncope and headaches.  All other systems reviewed and are negative.  Physical Exam Updated Vital Signs BP 116/85 (BP Location: Left Arm)   Pulse 86   Temp 98.7 F (37.1 C) (Oral)   Resp 18   Ht 5\' 11"  (1.803 m)   Wt 86.2 kg   SpO2 97%   BMI 26.50 kg/m   Physical Exam Constitutional:      General: He is not in acute distress. HENT:     Head: Normocephalic and atraumatic.  Eyes:     Conjunctiva/sclera: Conjunctivae normal.     Pupils: Pupils are equal, round, and reactive to light.  Cardiovascular:     Rate and Rhythm: Normal rate and regular rhythm.  Pulmonary:     Effort: Pulmonary effort is normal. No respiratory distress.  Abdominal:     General: There is no distension.     Tenderness: There is abdominal tenderness in the suprapubic area. There is no guarding or rebound. Negative signs include Murphy's sign and McBurney's sign.  Skin:    General: Skin is warm and dry.  Neurological:     General: No focal deficit present.     Mental Status: He is alert and oriented to person, place, and time. Mental status is at baseline.     Motor: No weakness.     Comments: 5/5 strength in lower extremities No saddle anesthesia Sensation intact  Psychiatric:        Mood and Affect: Mood normal.        Behavior: Behavior normal.    ED Results / Procedures / Treatments   Labs (all labs ordered are listed, but only abnormal results are displayed) Labs Reviewed  BASIC METABOLIC PANEL - Abnormal; Notable for the following components:      Result Value   Potassium 3.4 (*)    Glucose, Bld 105 (*)    Creatinine, Ser 1.35 (*)    GFR, Estimated 58  (*)    All other components within normal limits  URINALYSIS, ROUTINE W REFLEX MICROSCOPIC  CBC WITH DIFFERENTIAL/PLATELET    EKG None  Radiology CT ABDOMEN PELVIS WO CONTRAST  Result Date: 03/16/2021 CLINICAL DATA:  65 year old male with hematuria and abdominal and pelvic pain. EXAM: CT ABDOMEN AND PELVIS WITHOUT CONTRAST TECHNIQUE: Multidetector CT imaging of the abdomen  and pelvis was performed following the standard protocol without IV contrast. COMPARISON:  None. FINDINGS: Please note that parenchymal abnormalities may be missed without intravenous contrast. Lower chest: No acute abnormality. Hepatobiliary: Mild hepatic steatosis is noted. A 1.5 cm hypodense lesion within the posterior RIGHT liver (series 2: Image 21) is nonspecific but measures fluid density in some portions. No other hepatic abnormalities are noted. The gallbladder is unremarkable.  No biliary dilatation. Pancreas: Unremarkable Spleen: Unremarkable Adrenals/Urinary Tract: The kidneys, adrenal glands and bladder are unremarkable. Stomach/Bowel: Mild wall thickening of the proximal sigmoid colon with adjacent inflammation likely represents acute diverticulitis. Extensive diverticulosis within the descending and sigmoid colon noted. There is no evidence of focal collection or pneumoperitoneum. No bowel obstruction is identified.  The appendix is normal. Vascular/Lymphatic: Aortic atherosclerosis. No enlarged abdominal or pelvic lymph nodes. Reproductive: Mild prostate enlargement noted. Other: No ascites. Small umbilical hernia containing fat is present. Musculoskeletal: No acute or suspicious bony abnormalities are identified. IMPRESSION: 1. Proximal sigmoid colon wall thickening with adjacent inflammation likely representing acute diverticulitis. Clinical follow-up recommended. No evidence of focal collection or pneumoperitoneum. 2. Mild hepatic steatosis. 3. 1.5 cm nonspecific hypodense lesion within the posterior RIGHT liver, more  likely to be benign. If no prior outside studies are available for comparison, consider elective MRI for further evaluation, especially if this patient has a history of primary malignancy. 4. Aortic Atherosclerosis (ICD10-I70.0). Electronically Signed   By: Harmon Pier M.D.   On: 03/16/2021 09:38    Procedures Procedures   Medications Ordered in ED Medications  ciprofloxacin (CIPRO) tablet 500 mg (500 mg Oral Given 03/16/21 1005)  metroNIDAZOLE (FLAGYL) tablet 500 mg (500 mg Oral Given 03/16/21 1005)    ED Course  I have reviewed the triage vital signs and the nursing notes.  Pertinent labs & imaging results that were available during my care of the patient were reviewed by me and considered in my medical decision making (see chart for details).  65 yo male here with difficulty urinating for several days. Ddx includes BPH vs UTI vs other  We'll check UA for infection Bladder scan, possible foley placement  * Labs reviewed - no acute findings, Cr mildly elevated but was 1.1 two years ago per care everywhere  Clinical Course as of 03/16/21 1634  Sun Mar 16, 2021  2831 Repeat bladder scan showed no urine in the bladder.  We ordered a CT scan of the abdomen.  I have also asked for lab work.  We need to ensure that he is not in kidney failure, or does not have an obstruction [MT]  0944 CT scan is consistent with diverticulitis.  UA without sign of infection.  No overt evidence of bladder obstruction on CT.  I suspect he is likely having referred pain from his colonic inflammation.  We will start him on antibiotics.  Encouraged him to drink more water at home.  He can follow-up with his PCP. [MT]    Clinical Course User Index [MT] Briella Hobday, Kermit Balo, MD    Final Clinical Impression(s) / ED Diagnoses Final diagnoses:  Diverticulitis  Liver lesion    Rx / DC Orders ED Discharge Orders          Ordered    ciprofloxacin (CIPRO) 500 MG tablet  Every 12 hours        03/16/21 0956     metroNIDAZOLE (FLAGYL) 500 MG tablet  3 times daily        03/16/21 0956    tamsulosin (  FLOMAX) 0.4 MG CAPS capsule  Daily after breakfast,   Status:  Discontinued        03/16/21 0956    tamsulosin (FLOMAX) 0.4 MG CAPS capsule  Daily after breakfast        03/16/21 1002             Terald Sleeper, MD 03/16/21 1635

## 2021-09-25 ENCOUNTER — Other Ambulatory Visit: Payer: Self-pay

## 2021-09-26 ENCOUNTER — Ambulatory Visit (INDEPENDENT_AMBULATORY_CARE_PROVIDER_SITE_OTHER): Payer: Medicare PPO | Admitting: Family Medicine

## 2021-09-26 ENCOUNTER — Encounter: Payer: Self-pay | Admitting: Family Medicine

## 2021-09-26 VITALS — BP 126/82 | HR 90 | Temp 97.2°F | Ht 71.0 in | Wt 202.8 lb

## 2021-09-26 DIAGNOSIS — E782 Mixed hyperlipidemia: Secondary | ICD-10-CM

## 2021-09-26 DIAGNOSIS — F17200 Nicotine dependence, unspecified, uncomplicated: Secondary | ICD-10-CM

## 2021-09-26 DIAGNOSIS — F1221 Cannabis dependence, in remission: Secondary | ICD-10-CM

## 2021-09-26 DIAGNOSIS — G4733 Obstructive sleep apnea (adult) (pediatric): Secondary | ICD-10-CM | POA: Insufficient documentation

## 2021-09-26 DIAGNOSIS — J302 Other seasonal allergic rhinitis: Secondary | ICD-10-CM | POA: Insufficient documentation

## 2021-09-26 DIAGNOSIS — M545 Low back pain, unspecified: Secondary | ICD-10-CM | POA: Insufficient documentation

## 2021-09-26 DIAGNOSIS — F322 Major depressive disorder, single episode, severe without psychotic features: Secondary | ICD-10-CM | POA: Insufficient documentation

## 2021-09-26 DIAGNOSIS — R918 Other nonspecific abnormal finding of lung field: Secondary | ICD-10-CM | POA: Insufficient documentation

## 2021-09-26 DIAGNOSIS — I10 Essential (primary) hypertension: Secondary | ICD-10-CM | POA: Diagnosis not present

## 2021-09-26 DIAGNOSIS — M1990 Unspecified osteoarthritis, unspecified site: Secondary | ICD-10-CM | POA: Insufficient documentation

## 2021-09-26 DIAGNOSIS — I700 Atherosclerosis of aorta: Secondary | ICD-10-CM | POA: Insufficient documentation

## 2021-09-26 DIAGNOSIS — K5730 Diverticulosis of large intestine without perforation or abscess without bleeding: Secondary | ICD-10-CM | POA: Insufficient documentation

## 2021-09-26 DIAGNOSIS — Z131 Encounter for screening for diabetes mellitus: Secondary | ICD-10-CM

## 2021-09-26 DIAGNOSIS — M5417 Radiculopathy, lumbosacral region: Secondary | ICD-10-CM

## 2021-09-26 DIAGNOSIS — Z23 Encounter for immunization: Secondary | ICD-10-CM | POA: Diagnosis not present

## 2021-09-26 DIAGNOSIS — F4310 Post-traumatic stress disorder, unspecified: Secondary | ICD-10-CM

## 2021-09-26 DIAGNOSIS — F1021 Alcohol dependence, in remission: Secondary | ICD-10-CM

## 2021-09-26 DIAGNOSIS — F1421 Cocaine dependence, in remission: Secondary | ICD-10-CM

## 2021-09-26 DIAGNOSIS — Z125 Encounter for screening for malignant neoplasm of prostate: Secondary | ICD-10-CM

## 2021-09-26 NOTE — Progress Notes (Signed)
[facility] [address] [place] [address] Dept: [phone] Dept Fax: [phone]  New Patient Office Visit  Subjective:    Patient ID: Briscoe Burns, male    DOB: 10/25/1955, 66 y.o..   MRN: [id number]  Chief Complaint  Patient presents with   Establish Care    NP-establish care.  No concerns.      History of Present Illness:  Patient is in today to establish care. Mr. Dahmen was born in [place], [place]. 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 He returned in 1994, as he had met his futre widfe. They have now been married for 30 years. They have two sons (29, 66) who still live at home. 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   Mr. Branham smokes 1 1/2 ppd of cigarettes. He has tried to cur back and is currently using Nicorette gum. He notes he did have LDCT lung cancer screening last year at the [facility]. Mr. Mangieri has a history of alcohol abuse, which is currently in remission. He last drank 5 years ago. He also has a more distant histor cocaine and marijuana dependence, which are also in remission.  Mr. Vise notes that he is current ly disabled. This is primarily due to PTSD. He is seen for therapy at the [facility] in [place].  Mr. Vernier has a history of multiple orthopedic complaints. He notes he previously had significant knee pain issues, which eventually resolved. Currently, he has struggled with low back pain. He is seeing Dr. Posey Pronto (neurosurgery) who recently did a nerve ablation in his lower back. He finds this has helped a great deal. He also is seeing Dr. Foy Guadalajara for pain management. He is on diclofenac, gabapentin, methocarbamol, nortriptyline, tizanidine, and tramadol (less than daily use). He also uses lidocaine  patches as needed.  Mr. Ballinas has a history of hypertension. He is managed on Zestoretic.  Mr. Lamp has a history of hyperlipidemia. He is managed on atorvastatin.  Mr. Beaubrun has a history of seasonal allergic rhinitis. He manages this with Claritin and Flonase as needed.  Mr. Greenup has a history of GERD. He is managed on Prilosec.  Past Medical History: Patient Active Problem List   Diagnosis Date Noted   Osteoarthrosis 09/26/2021   Other and unspecified alcohol dependence, unspecified drinking behavior 09/26/2021   Obstructive sleep apnea (adult) (pediatric) 09/26/2021   Multiple nodules of lung 09/26/2021   Major depressive disorder, single episode, severe ([place]) 09/26/2021   Low back pain 09/26/2021   Diverticular disease of colon 09/26/2021   Cocaine dependence, in remission ([place]) 09/26/2021   Cannabis dependence, in remission ([place]) 09/26/2021   Lumbosacral radiculitis 09/26/2021   Tobacco dependence 09/26/2021   PTSD (post-traumatic stress disorder) 02/17/2018   HLD (hyperlipidemia) 02/17/2018   GERD (gastroesophageal reflux disease) 02/17/2018   Essential hypertension 02/17/2018   Left-sided trigeminal neuralgia 06/23/2017   Atypical facial pain 02/02/2017   History of colon polyps 09/14/2004   History reviewed. No pertinent surgical history.  Family History  Problem Relation Age of Onset   Diabetes Mother    Hypertension Mother    Kidney failure Mother    Hypertension Father    Prostate cancer Father    Diabetes Sister    Cancer Paternal Grandfather  Lung   Outpatient Medications Prior to Visit  Medication Sig Dispense Refill   acetaminophen (TYLENOL) 500 MG tablet Take 1 tablet by mouth 3 (three) times daily as needed.     aspirin EC 81 MG tablet Take 81 mg by mouth daily.     atorvastatin (LIPITOR) 20 MG tablet Take 20 mg by mouth daily.     diclofenac (VOLTAREN) 75 MG EC tablet Take 1 tablet (75 mg total) by mouth 2 (two) times daily. 30 tablet 0    gabapentin (NEURONTIN) 300 MG capsule Take 1 capsule by mouth at bedtime.     lidocaine (LIDODERM) 5 % APPLY 1 PATCH TO SKIN ONCE DAILY (APPLY FOR 12 HOURS, THEN REMOVE FOR 12 HOURS)     lisinopril-hydrochlorothiazide (ZESTORETIC) 20-12.5 MG tablet Take 1 tablet by mouth daily.     loratadine (CLARITIN) 10 MG tablet Take 1 tablet by mouth daily.     methocarbamol (ROBAXIN) 750 MG tablet Take 1 tablet by mouth 3 (three) times daily as needed.     nortriptyline (PAMELOR) 25 MG capsule Take 3 capsules (75 mg total) by mouth at bedtime. 90 capsule 11   omeprazole (PRILOSEC) 20 MG capsule Take 20 mg by mouth daily.     tiZANidine (ZANAFLEX) 4 MG tablet Take 1-2 tablets (4-8 mg total) by mouth every 6 (six) hours as needed for muscle spasms. 21 tablet 0   traMADol (ULTRAM) 50 MG tablet Take 50 mg by mouth 3 (three) times daily as needed.     hydrochlorothiazide (HYDRODIURIL) 25 MG tablet Take 25 mg by mouth daily.     lisinopril (PRINIVIL,ZESTRIL) 40 MG tablet Take 40 mg by mouth daily.     ciprofloxacin (CIPRO) 500 MG tablet Take 1 tablet (500 mg total) by mouth every 12 (twelve) hours. 10 tablet 0   HYDROcodone-acetaminophen (NORCO) 7.5-325 MG tablet Take 1 tablet by mouth every 6 (six) hours as needed for moderate pain. 20 tablet 0   lidocaine (XYLOCAINE) 2 % solution Use as directed 20 mLs in the mouth or throat as needed for mouth pain.     No facility-administered medications prior to visit.   No Known Allergies    Objective:   Today's Vitals   09/26/21 1521  BP: 126/82  Pulse: 90  Temp: (!) 97.2 F (36.2 C)  TempSrc: Temporal  SpO2: 98%  Weight: 202 lb 12.8 oz (92 kg)  Height: $Remove'5\' 11"'ffWuKrh$  (1.803 m)   Body mass index is 28.28 kg/m.   General: Well developed, well nourished. No acute distress. Psych: Alert and oriented. Normal mood and affect.  Health Maintenance Due  Topic Date Due   Hepatitis C Screening  Never done   Zoster Vaccines- Shingrix (1 of 2) Never done   Pneumonia  Vaccine 16+ Years old (2 - PCV) 11/24/2012   COLONOSCOPY (Pts 45-58yrs Insurance coverage will need to be confirmed)  09/19/2014   COVID-19 Vaccine (3 - Pfizer risk series) 01/30/2020   Imaging: CT Abdomen and Pelvis (03/16/2021) IMPRESSION: 1. Proximal sigmoid colon wall thickening with adjacent inflammation likely representing acute diverticulitis. Clinical follow-up recommended. No evidence of focal collection or pneumoperitoneum. 2. Mild hepatic steatosis. 3. 1.5 cm nonspecific hypodense lesion within the posterior RIGHT liver, more likely to be benign. If no prior outside studies are available for comparison, consider elective MRI for further evaluation, especially if this patient has a history of primary malignancy. 4. Aortic Atherosclerosis (ICD10-I70.0).    Assessment & Plan:   1. Essential hypertension Blood pressure is  at goal. Continue Zestoretic.  - Comprehensive metabolic panel; Future  2. Lumbosacral radiculitis Responding well to recent ablation. Cotninue to follow with neurosurgery and pain management.  3. PTSD (post-traumatic stress disorder) [facility]  4. Mixed hyperlipidemia 5. Aortic atherosclerosis ([place]) I will check his lipids. Plan to continue atorvastatin. I recommend he stop tobacco use. - Lipid panel; Future  6. Tobacco dependence Recommend he stop tobacco use. I will discuss further about potential medical support for this at his next visit.  7. Alcohol dependence in remission ([place]) 8. Cocaine dependence, in remission ([place]) 9. Cannabis dependence, in remission [facility]) Managing well, but remains at risk for relapse.  10. Screening for prostate cancer  - PSA; Future  11. Screening for diabetes mellitus (DM)  - Hemoglobin A1c; Future  12. Need for pneumococcal vaccination  - Pneumococcal conjugate vaccine 20-valent (Prevnar 20)  Haydee Salter, MD

## 2021-09-29 ENCOUNTER — Other Ambulatory Visit: Payer: Self-pay

## 2021-09-29 ENCOUNTER — Other Ambulatory Visit (INDEPENDENT_AMBULATORY_CARE_PROVIDER_SITE_OTHER): Payer: Medicare PPO

## 2021-09-29 ENCOUNTER — Encounter: Payer: Self-pay | Admitting: Family Medicine

## 2021-09-29 DIAGNOSIS — E782 Mixed hyperlipidemia: Secondary | ICD-10-CM | POA: Diagnosis not present

## 2021-09-29 DIAGNOSIS — Z131 Encounter for screening for diabetes mellitus: Secondary | ICD-10-CM | POA: Diagnosis not present

## 2021-09-29 DIAGNOSIS — Z125 Encounter for screening for malignant neoplasm of prostate: Secondary | ICD-10-CM | POA: Diagnosis not present

## 2021-09-29 DIAGNOSIS — I10 Essential (primary) hypertension: Secondary | ICD-10-CM

## 2021-09-29 DIAGNOSIS — R739 Hyperglycemia, unspecified: Secondary | ICD-10-CM | POA: Insufficient documentation

## 2021-09-29 LAB — COMPREHENSIVE METABOLIC PANEL
ALT: 14 U/L (ref 0–53)
AST: 12 U/L (ref 0–37)
Albumin: 4.6 g/dL (ref 3.5–5.2)
Alkaline Phosphatase: 79 U/L (ref 39–117)
BUN: 9 mg/dL (ref 6–23)
CO2: 28 mEq/L (ref 19–32)
Calcium: 9.6 mg/dL (ref 8.4–10.5)
Chloride: 101 mEq/L (ref 96–112)
Creatinine, Ser: 1.1 mg/dL (ref 0.40–1.50)
GFR: 70.06 mL/min (ref >60.00)
Glucose, Bld: 112 mg/dL — ABNORMAL HIGH (ref 70–99)
Potassium: 3.7 mEq/L (ref 3.5–5.1)
Sodium: 139 mEq/L (ref 135–145)
Total Bilirubin: 0.9 mg/dL (ref 0.2–1.2)
Total Protein: 7.9 g/dL (ref 6.0–8.3)

## 2021-09-29 LAB — LIPID PANEL
Cholesterol: 182 mg/dL (ref 0–200)
HDL: 45.8 mg/dL (ref >39.00)
LDL Cholesterol: 108 mg/dL — ABNORMAL HIGH (ref 0–99)
NonHDL: 135.76
Total CHOL/HDL Ratio: 4
Triglycerides: 139 mg/dL (ref 0.0–149.0)
VLDL: 27.8 mg/dL (ref 0.0–40.0)

## 2021-09-29 LAB — HEMOGLOBIN A1C: Hgb A1c MFr Bld: 6.6 % — ABNORMAL HIGH (ref 4.6–6.5)

## 2021-09-29 LAB — PSA: PSA: 1.81 ng/mL (ref 0.10–4.00)

## 2021-09-29 NOTE — Progress Notes (Signed)
Per orders of Dr. Veto Kemps, pt was seen in clinic today for lab work. Blood was drawn on the left median cubital vein. Pt tolerated blood draw well. Sw, cma

## 2021-10-28 ENCOUNTER — Telehealth: Payer: Self-pay | Admitting: Family Medicine

## 2021-10-28 NOTE — Telephone Encounter (Signed)
Please see note.

## 2021-10-28 NOTE — Telephone Encounter (Signed)
Patient would like to switch provider due to location inconvenience.  Can patient be approved for TOC? From Dr. Veto Kemps to Dr. Ruthine Dose?

## 2021-10-29 NOTE — Telephone Encounter (Signed)
Patient has been scheduled

## 2021-11-18 ENCOUNTER — Ambulatory Visit: Payer: Medicare HMO | Admitting: Podiatry

## 2021-11-19 DIAGNOSIS — H524 Presbyopia: Secondary | ICD-10-CM | POA: Diagnosis not present

## 2021-11-19 DIAGNOSIS — Z0100 Encounter for examination of eyes and vision without abnormal findings: Secondary | ICD-10-CM | POA: Diagnosis not present

## 2021-12-04 ENCOUNTER — Ambulatory Visit: Payer: Medicare HMO | Admitting: Podiatry

## 2021-12-09 ENCOUNTER — Ambulatory Visit (INDEPENDENT_AMBULATORY_CARE_PROVIDER_SITE_OTHER): Payer: Medicare HMO | Admitting: Podiatry

## 2021-12-09 ENCOUNTER — Other Ambulatory Visit: Payer: Self-pay

## 2021-12-09 DIAGNOSIS — M79674 Pain in right toe(s): Secondary | ICD-10-CM

## 2021-12-09 DIAGNOSIS — L84 Corns and callosities: Secondary | ICD-10-CM

## 2021-12-09 DIAGNOSIS — M79672 Pain in left foot: Secondary | ICD-10-CM | POA: Diagnosis not present

## 2021-12-09 DIAGNOSIS — G6289 Other specified polyneuropathies: Secondary | ICD-10-CM | POA: Diagnosis not present

## 2021-12-09 DIAGNOSIS — M79671 Pain in right foot: Secondary | ICD-10-CM

## 2021-12-09 DIAGNOSIS — B351 Tinea unguium: Secondary | ICD-10-CM | POA: Diagnosis not present

## 2021-12-09 DIAGNOSIS — M79675 Pain in left toe(s): Secondary | ICD-10-CM

## 2021-12-09 NOTE — Patient Instructions (Signed)
Look for urea 40% cream or ointment and apply to the thickened dry skin / calluses. This can be bought over the counter, at a pharmacy or online such as [employer]. ? ? ?Use salicylic acid corn remover on the most painful areas  ?

## 2021-12-12 NOTE — Progress Notes (Signed)
?  Subjective:  ?Patient ID: Reginald Mody., male    DOB: 1956/02/22,  MRN: [id number] ? ?Chief Complaint  ?Patient presents with  ? Nail Problem  ?      np re est care/ possibly neuropathy/ calluses  ? ? ?66 y.o. male presents with the above complaint. History confirmed with patient.  His nails are thickened elongated and causing pain.  He has calluses on both heels.  He has numbness tingling and burning in his feet ? ?Objective:  ?Physical Exam: ?warm, good capillary refill, no trophic changes or ulcerative lesions, normal DP and PT pulses, abnormal sensory exam, and large diffuse callus plantar heel as well as submetatarsal 5 on the left with porokeratosis. ?Left Foot: dystrophic yellowed discolored nail plates with subungual debris ?Right Foot: dystrophic yellowed discolored nail plates with subungual debris ? ?Assessment:  ? ?1. Corns and callosities   ?2. Pain in both feet   ?3. Other polyneuropathy   ?4. Pain due to onychomycosis of toenails of both feet   ? ? ? ?Plan:  ?Patient was evaluated and treated and all questions answered. ? ?All symptomatic hyperkeratoses were safely debrided with a sterile #15 blade to patient's level of comfort without incident. We discussed preventative and palliative care of these lesions including supportive and accommodative shoegear, padding, prefabricated and custom molded accommodative orthoses, use of a pumice stone and lotions/creams daily.  Recommended urea 40% cream as well as salicylic acid corn remover ? ?Discussed the etiology and treatment options for the condition in detail with the patient. Educated patient on the topical and oral treatment options for mycotic nails. Recommended debridement of the nails today. Sharp and mechanical debridement performed of all painful and mycotic nails today. Nails debrided in length and thickness using a nail nipper to level of comfort. Discussed treatment options including appropriate shoe gear. Follow up as needed for  painful nails. ? ? ? ? ?Return if symptoms worsen or fail to improve.  ? ?

## 2021-12-26 ENCOUNTER — Ambulatory Visit: Payer: Medicare PPO | Admitting: Family Medicine

## 2021-12-26 ENCOUNTER — Encounter: Payer: Medicare HMO | Admitting: Family Medicine

## 2022-01-28 ENCOUNTER — Encounter: Payer: Self-pay | Admitting: Family Medicine

## 2022-01-28 ENCOUNTER — Ambulatory Visit (INDEPENDENT_AMBULATORY_CARE_PROVIDER_SITE_OTHER): Payer: Medicare HMO | Admitting: Family Medicine

## 2022-01-28 VITALS — BP 130/90 | HR 66 | Temp 98.2°F | Ht 71.0 in | Wt 202.5 lb

## 2022-01-28 DIAGNOSIS — R351 Nocturia: Secondary | ICD-10-CM

## 2022-01-28 DIAGNOSIS — R739 Hyperglycemia, unspecified: Secondary | ICD-10-CM | POA: Diagnosis not present

## 2022-01-28 DIAGNOSIS — I10 Essential (primary) hypertension: Secondary | ICD-10-CM | POA: Diagnosis not present

## 2022-01-28 DIAGNOSIS — N401 Enlarged prostate with lower urinary tract symptoms: Secondary | ICD-10-CM | POA: Diagnosis not present

## 2022-01-28 DIAGNOSIS — F4310 Post-traumatic stress disorder, unspecified: Secondary | ICD-10-CM | POA: Diagnosis not present

## 2022-01-28 NOTE — Progress Notes (Signed)
? ?Subjective:  ? ? ? Patient ID: Reginald Cos Sr., male    DOB: [birth year], 66 y.o.   MRN: [id number] ? ?Chief Complaint  ?Patient presents with  ? Transfer of Care  ?  Due for colonoscopy ?Discuss shingles vaccine  ? ? ?HPI ?TOC Dr, Veto [place]ps ?June 23-cscope sch at [place]. H/o polyps ? HTN-130/90.  Morning HA-wearing cpap some days-having trouble w/mask-[facility]  No cp/palp/edema/cough/sob.  [facility]ises [place] [facility] and walks ?PTSD-status quo-on meds. No SI. Managed by psych at [facility] ?Told "prostate swollen". Nocturia x2-3-on meds. Tamsulosin helping so nocturia x1. ?New DM-pt unaware.  Discussed labs from [place] here. Drinks a lot of sugary drinks and diet not great.  Wife has DM ? ?Health Maintenance Due  ?Topic Date Due  ? Hepatitis C Screening  Never done  ? Zoster Vaccines- Shingrix (1 of 2) Never done  ? COLONOSCOPY (Pts 45-43yrs Insurance coverage will need to be confirmed)  09/19/2014  ? ? ?Past Medical History:  ?Diagnosis Date  ? Anxiety and depression   ? GERD (gastroesophageal reflux disease)   ? Hypercholesteremia   ? Hypertension   ? Left facial pain   ? Migraine   ? PTSD (post-traumatic stress disorder)   ? ? ?History reviewed. No pertinent surgical history. ? ?Outpatient Medications Prior to Visit  ?Medication Sig Dispense Refill  ? acetaminophen (TYLENOL) 500 MG tablet Take 1 tablet by mouth 3 (three) times daily as needed.    ? aspirin EC 81 MG tablet Take 81 mg by mouth daily.    ? atorvastatin (LIPITOR) 20 MG tablet Take 20 mg by mouth daily.    ? diclofenac (VOLTAREN) 75 MG EC tablet Take 1 tablet (75 mg total) by mouth 2 (two) times daily. 30 tablet 0  ? fluticasone (FLONASE) 50 MCG/ACT nasal spray INSTILL 1 SPRAY IN EACH NOSTRIL AT BEDTIME    ? gabapentin (NEURONTIN) 300 MG capsule Take 1 capsule by mouth at bedtime.    ? lidocaine (LIDODERM) 5 % APPLY 1 PATCH TO SKIN ONCE DAILY (APPLY FOR 12 HOURS, THEN REMOVE FOR 12 HOURS)    ? lisinopril-hydrochlorothiazide (ZESTORETIC) 20-12.5 MG tablet Take 1  tablet by mouth 2 (two) times daily.    ? loratadine (CLARITIN) 10 MG tablet Take 1 tablet by mouth daily.    ? methocarbamol (ROBAXIN) 750 MG tablet Take 1 tablet by mouth 3 (three) times daily as needed.    ? nortriptyline (PAMELOR) 75 MG capsule TAKE ONE CAPSULE BY MOUTH AT BEDTIME AS NEEDED (DOSE INCREASE)    ? omeprazole (PRILOSEC) 20 MG capsule Take 20 mg by mouth daily.    ? tamsulosin (FLOMAX) 0.4 MG CAPS capsule Take by mouth.    ? tiZANidine (ZANAFLEX) 4 MG tablet Take 1-2 tablets (4-8 mg total) by mouth every 6 (six) hours as needed for muscle spasms. 21 tablet 0  ? traMADol (ULTRAM) 50 MG tablet Take 50 mg by mouth 3 (three) times daily as needed.    ? lisinopril-hydrochlorothiazide (ZESTORETIC) 20-12.5 MG tablet Take 1 tablet by mouth daily.    ? nortriptyline (PAMELOR) 25 MG capsule Take 3 capsules (75 mg total) by mouth at bedtime. 90 capsule 11  ? ?No facility-administered medications prior to visit.  ? ? ?No Known Allergies ?ROS neg/noncontributory except as noted HPI/below ? ? ?   ?Objective:  ?  ? ?BP 130/90   Pulse 66   Temp 98.2 ?F (36.8 ?C) (Temporal)   Ht 5\' 11"  (1.803 m)  Wt 202 lb 8 oz (91.9 kg)   SpO2 96%   BMI 28.24 kg/m?  ?Wt Readings from Last 3 Encounters:  ?01/28/22 202 lb 8 oz (91.9 kg)  ?09/26/21 202 lb 12.8 oz (92 kg)  ?03/16/21 190 lb (86.2 kg)  ? ? ?Physical Exam  ? ?Gen: WDWN NAD AAM ?HEENT: NCAT, conjunctiva not injected, sclera nonicteric ?NECK:  supple, no thyromegaly, no nodes, no carotid bruits ?CARDIAC: RRR, S1S2+, no murmur. DP 2+B ?LUNGS: CTAB. No wheezes ?ABDOMEN:  BS+, soft, NTND, No HSM, no masses ?EXT:  no edema ?MSK: no gross abnormalities.  ?NEURO: A&O x3.  CN II-XII intact.  ?PSYCH: normal mood. Good eye contact ? ?   ?Assessment & Plan:  ? ?Problem List Items Addressed This Visit   ? ?  ? Cardiovascular and Mediastinum  ? Essential hypertension - Primary  ? Relevant Medications  ? lisinopril-hydrochlorothiazide (ZESTORETIC) 20-12.5 MG tablet  ?  ? Other  ?  PTSD (post-traumatic stress disorder)  ? Relevant Medications  ? nortriptyline (PAMELOR) 75 MG capsule  ? Hyperglycemia  ? ?Other Visit Diagnoses   ? ? Benign prostatic hyperplasia with nocturia      ? ?  ? HTN-chronic. well controlled on zestoretic 20/12.5.  cont meds.  Work onTLC.  [facility] ?BPH-chronic. Stable. better controlled on Tamsulosin-cont ?3.  PTSD-chronic/stable.  Sees MH at [facility]  cont. ?4.  Hyperglycemia-now DM-A1C 6.6 in Jan.  Discussed labs w/pt.  He will make a lot of TLC.  F/u 6 mo ?5.  HA in am-has osa.  Not wearing cpap d/t mask-[facility] ? ?No orders of the defined types were placed in this encounter. ? ? ?[name], MD ? ?

## 2022-01-28 NOTE — Patient Instructions (Signed)
It was very nice to see you today!  Work on diet/exercise   PLEASE NOTE:  If you had any lab tests please let us know if you have not heard back within a few days. You may see your results on MyChart before we have a chance to review them but we will give you a call once they are reviewed by us. If we ordered any referrals today, please let us know if you have not heard from their office within the next week.   Please try these tips to maintain a healthy lifestyle:  Eat most of your calories during the day when you are active. Eliminate processed foods including packaged sweets (pies, cakes, cookies), reduce intake of potatoes, white bread, white pasta, and white rice. Look for whole grain options, oat flour or almond flour.  Each meal should contain half fruits/vegetables, one quarter protein, and one quarter carbs (no bigger than a computer mouse).  Cut down on sweet beverages. This includes juice, soda, and sweet tea. Also watch fruit intake, though this is a healthier sweet option, it still contains natural sugar! Limit to 3 servings daily.  Drink at least 1 glass of water with each meal and aim for at least 8 glasses per day  Exercise at least 150 minutes every week.   

## 2022-02-10 ENCOUNTER — Encounter (HOSPITAL_COMMUNITY): Payer: Self-pay

## 2022-02-10 ENCOUNTER — Other Ambulatory Visit: Payer: Self-pay

## 2022-02-10 ENCOUNTER — Emergency Department (HOSPITAL_COMMUNITY)
Admission: EM | Admit: 2022-02-10 | Discharge: 2022-02-10 | Disposition: A | Discharge status: Home / Self Care | Payer: No Typology Code available for payment source | Attending: Emergency Medicine | Admitting: Emergency Medicine

## 2022-02-10 DIAGNOSIS — Z79899 Other long term (current) drug therapy: Secondary | ICD-10-CM | POA: Diagnosis not present

## 2022-02-10 DIAGNOSIS — M62830 Muscle spasm of back: Secondary | ICD-10-CM | POA: Diagnosis not present

## 2022-02-10 DIAGNOSIS — M545 Low back pain, unspecified: Secondary | ICD-10-CM | POA: Diagnosis present

## 2022-02-10 DIAGNOSIS — M542 Cervicalgia: Secondary | ICD-10-CM | POA: Insufficient documentation

## 2022-02-10 DIAGNOSIS — Z7982 Long term (current) use of aspirin: Secondary | ICD-10-CM | POA: Diagnosis not present

## 2022-02-10 MED ORDER — LIDOCAINE 5 % EX PTCH
1.0000 | MEDICATED_PATCH | CUTANEOUS | Status: DC
  Administered 2022-02-10: 1 via TRANSDERMAL
  Filled 2022-02-10: qty 1

## 2022-02-10 MED ORDER — METHOCARBAMOL 500 MG PO TABS: 500.0000 mg | ORAL_TABLET | Freq: Two times a day (BID) | ORAL | 0 refills | Status: DC

## 2022-02-10 MED ORDER — METHOCARBAMOL 500 MG PO TABS
1000.0000 mg | ORAL_TABLET | Freq: Once | ORAL | Status: AC
  Administered 2022-02-10: 1000 mg via ORAL
  Filled 2022-02-10: qty 2

## 2022-02-10 MED ORDER — HYDROCODONE-ACETAMINOPHEN 5-325 MG PO TABS: 1.0000 | ORAL_TABLET | Freq: Four times a day (QID) | ORAL | 0 refills | Status: DC | PRN

## 2022-02-10 MED ORDER — KETOROLAC TROMETHAMINE 15 MG/ML IJ SOLN: 15.0000 mg | Freq: Once | INTRAMUSCULAR | Status: DC

## 2022-02-10 MED ORDER — LIDOCAINE 5 % EX PTCH: MEDICATED_PATCH | CUTANEOUS | 0 refills | Status: DC

## 2022-02-10 MED ORDER — HYDROCODONE-ACETAMINOPHEN 5-325 MG PO TABS
1.0000 | ORAL_TABLET | Freq: Once | ORAL | Status: AC
  Administered 2022-02-10: 1 via ORAL
  Filled 2022-02-10: qty 1

## 2022-02-10 MED ORDER — KETOROLAC TROMETHAMINE 15 MG/ML IJ SOLN
15.0000 mg | Freq: Once | INTRAMUSCULAR | Status: AC
  Administered 2022-02-10: 15 mg via INTRAMUSCULAR
  Filled 2022-02-10: qty 1

## 2022-02-10 NOTE — ED Triage Notes (Signed)
Patient c/o right lower back spasms and worse this AM. Patient states pain radiates into the left leg.

## 2022-02-10 NOTE — Discharge Instructions (Addendum)
Please read and follow all provided instructions.  Your diagnoses today include:  1. Acute left-sided low back pain without sciatica     Tests performed today include: Vital signs - see below for your results today  Medications prescribed:  Robaxin (methocarbamol) - muscle relaxer medication  DO NOT drive or perform any activities that require you to be awake and alert because this medicine can make you drowsy.   Vicodin (hydrocodone/acetaminophen) - narcotic pain medication  DO NOT combine with tramadol.   DO NOT drive or perform any activities that require you to be awake and alert because this medicine can make you drowsy. BE VERY CAREFUL not to take multiple medicines containing Tylenol (also called acetaminophen). Doing so can lead to an overdose which can damage your liver and cause liver failure and possibly death.  Use pain medication only under direct supervision at the lowest possible dose needed to control your pain.    Take any prescribed medications only as directed.  Home care instructions:  Follow any educational materials contained in this packet Please rest, use ice or heat on your back for the next several days Do not lift, push, pull anything more than 10 pounds for the next week  Follow-up instructions: Please follow-up with your primary care provider in the next 3 days for further evaluation of your symptoms.   Return instructions:  SEEK IMMEDIATE MEDICAL ATTENTION IF YOU HAVE: New numbness, tingling, weakness, or problem with the use of your arms or legs Severe back pain not relieved with medications Loss control of your bowels or bladder Increasing pain in any areas of the body (such as chest or abdominal pain) Shortness of breath, dizziness, or fainting.  Worsening nausea (feeling sick to your stomach), vomiting, fever, or sweats Any other emergent concerns regarding your health   Additional Information:  Your vital signs today were: BP (!)  154/102 (BP Location: Left Arm)   Pulse 79   Temp 98.3 F (36.8 C) (Oral)   Resp 16   Ht 5\' 11"  (1.803 m)   Wt 92.1 kg   SpO2 97%   BMI 28.31 kg/m  If your blood pressure (BP) was elevated above 135/85 this visit, please have this repeated by your doctor within one month. --------------

## 2022-02-10 NOTE — ED Provider Notes (Signed)
[facility]-EMERGENCY DEPT Provider Note   CSN: [id number] Arrival date & time: 02/10/22  1554     History  Chief Complaint  Patient presents with   Back Pain    Reginald Cos Sr. is a 66 y.o. male.  Patient with history of chronic back pain presents to the emergency department for evaluation of spasm in his left lower back starting yesterday and persisting this morning.  This is similar to pain that he has had in the past.  He reports that he has had surgeries and injections on his back previously.  He takes tramadol and has tried taking 2 of these pills without improvement.  Pain is worse with movement. Patient denies warning symptoms of back pain including: fecal incontinence, urinary retention or overflow incontinence, night sweats, waking from sleep with back pain, unexplained fevers or weight loss, h/o cancer, IVDU, recent trauma.         Home Medications Prior to Admission medications   Medication Sig Start Date End Date Taking? Authorizing Provider  acetaminophen (TYLENOL) 500 MG tablet Take 1 tablet by mouth 3 (three) times daily as needed. 01/19/20   [provider]  aspirin EC 81 MG tablet Take 81 mg by mouth daily.    [provider]  atorvastatin (LIPITOR) 20 MG tablet Take 20 mg by mouth daily.    [provider]  diclofenac (VOLTAREN) 75 MG EC tablet Take 1 tablet (75 mg total) by mouth 2 (two) times daily. 09/04/19   Eustace Moore, MD  fluticasone [facility]) 50 MCG/ACT nasal spray INSTILL 1 SPRAY IN EACH NOSTRIL AT BEDTIME 01/19/22   [provider]  gabapentin (NEURONTIN) 300 MG capsule Take 1 capsule by mouth at bedtime. 04/15/21   [provider]  lidocaine (LIDODERM) 5 % APPLY 1 PATCH TO SKIN ONCE DAILY (APPLY FOR 12 HOURS, THEN REMOVE FOR 12 HOURS) 08/25/19   [provider]  lisinopril-hydrochlorothiazide (ZESTORETIC) 20-12.5 MG tablet Take 1 tablet by mouth 2 (two) times daily. 01/19/22    [provider]  loratadine (CLARITIN) 10 MG tablet Take 1 tablet by mouth daily. 07/22/21   [provider]  methocarbamol (ROBAXIN) 750 MG tablet Take 1 tablet by mouth 3 (three) times daily as needed. 04/01/21   [provider]  nortriptyline (PAMELOR) 75 MG capsule TAKE ONE CAPSULE BY MOUTH AT BEDTIME AS NEEDED (DOSE INCREASE) 12/30/21   [provider]  omeprazole (PRILOSEC) 20 MG capsule Take 20 mg by mouth daily.    [provider]  tamsulosin (FLOMAX) 0.4 MG CAPS capsule Take by mouth. 04/01/21   [provider]  tiZANidine (ZANAFLEX) 4 MG tablet Take 1-2 tablets (4-8 mg total) by mouth every 6 (six) hours as needed for muscle spasms. 09/04/19   Eustace Moore, MD  traMADol (ULTRAM) 50 MG tablet Take 50 mg by mouth 3 (three) times daily as needed. 08/13/21   [provider]  Oxcarbazepine (TRILEPTAL) 300 MG tablet Take 1 tablet (300 mg total) by mouth 2 (two) times daily. 04/12/17 09/04/19  Levert Feinstein, MD  pregabalin (LYRICA) 300 MG capsule Take 1 capsule (300 mg total) by mouth 3 (three) times daily. 04/12/17 08/01/19  Levert Feinstein, MD      Allergies    Patient has no known allergies.    Review of Systems   Review of Systems  Physical Exam Updated Vital Signs BP (!) 154/102 (BP Location: Left Arm)   Pulse 79   Temp 98.3 F (36.8  C) (Oral)   Resp 16   Ht 5\' 11"  (1.803 m)   Wt 92.1 kg   SpO2 97%   BMI 28.31 kg/m   Physical Exam Vitals and nursing note reviewed.  Constitutional:      Appearance: He is well-developed.  HENT:     Head: Normocephalic and atraumatic.  Eyes:     Conjunctiva/sclera: Conjunctivae normal.  Abdominal:     Palpations: Abdomen is soft.     Tenderness: There is no abdominal tenderness. There is no right CVA tenderness or left CVA tenderness.  Musculoskeletal:        General: Normal range of motion.     Cervical back: Normal range of motion.     Thoracic back: Spasms and tenderness  present.     Lumbar back: Spasms and tenderness present.       Back:     Comments: No step-off noted with palpation of spine.   Skin:    General: Skin is warm and dry.  Neurological:     Mental Status: He is alert.     Sensory: No sensory deficit.     Motor: No abnormal muscle tone.     Comments: 5/5 strength in entire lower extremities bilaterally. No sensation deficit.   Psychiatric:        Mood and Affect: Mood normal.    ED Results / Procedures / Treatments   Labs (all labs ordered are listed, but only abnormal results are displayed) Labs Reviewed - No data to display  EKG None  Radiology No results found.  Procedures Procedures    Medications Ordered in ED Medications  lidocaine (LIDODERM) 5 % 1 patch (1 patch Transdermal Patch Applied 02/10/22 1635)  HYDROcodone-acetaminophen (NORCO/VICODIN) 5-325 MG per tablet 1 tablet (has no administration in time range)  methocarbamol (ROBAXIN) tablet 1,000 mg (1,000 mg Oral Given 02/10/22 1636)  ketorolac (TORADOL) 15 MG/ML injection 15 mg (15 mg Intramuscular Given 02/10/22 1636)    ED Course/ Medical Decision Making/ A&P    Patient seen and examined. History obtained directly from patient. Reviewed previous ED notes and urgent care notes with similar complaints.  Labs/EKG: None ordered.   Imaging: Considered imaging of the lower back however patient has only had pain for the past day and denies trauma.  Pain is similar to pain that he has had in the past.  Medications/Fluids: Ordered: IM Toradol, p.o. Robaxin, Lidoderm patch.  Most recent vital signs reviewed and are as follows: BP (!) 154/102 (BP Location: Left Arm)   Pulse 79   Temp 98.3 F (36.8 C) (Oral)   Resp 16   Ht 5\' 11"  (1.803 m)   Wt 92.1 kg   SpO2 97%   BMI 28.31 kg/m   Initial impression: Back spasms.  6:03 PM on reexam, patient appears stable.  He prefers to stand up in the room rather than sit on the bed.  He denies significant improvement at  this point with previous treatments.  However, he is asking for additional pain medicine and to be discharged.  We will give 1 hydrocodone prior to discharge.  Reviewed pertinent lab work and imaging with patient at bedside. Questions answered.   Plan: Discharge to home.   Prescriptions written for: #4 hydrocodone-acetaminophen 5/325 mg, Robaxin.  Patient counseled on proper use of muscle relaxant medication.  They were told not to drink alcohol, drive any vehicle, or do any dangerous activities while taking this medication.  Patient verbalized understanding.  Patient counseled on use of  narcotic pain medications. Counseled not to combine these medications with others containing tylenol. Urged not to drink alcohol, drive, or perform any other activities that requires focus while taking these medications. The patient verbalizes understanding and agrees with the plan.  Use pain medication only under direct supervision at the lowest possible dose needed to control your pain.   He states that he is planning to drive to [place] in 2 days.  I strongly encouraged patient to delay trip if requiring pain medication or muscle relaxer and again stressed not to drive.  Other home care instructions discussed: Patient was counseled on back pain precautions and told to do activity as tolerated but do not lift, push, or pull heavy objects more than 10 pounds for the next week.  Patient counseled to use ice or heat on back for no longer than 15 minutes every hour.   ED return instructions discussed: Urged to return with worsening severe pain, loss of bowel or bladder control, trouble walking.   Follow-up instructions discussed: Patient urged to follow-up with PCP if pain does not improve with treatment and rest in the next 3 days for recheck or if pain becomes recurrent in the future.   The patient verbalizes understanding and agrees with the plan.                          Medical Decision  Making Risk Prescription drug management.   Patient with back pain. No neurological deficits. Patient is ambulatory. No warning symptoms of back pain including: fecal incontinence, urinary retention or overflow incontinence, night sweats, waking from sleep with back pain, unexplained fevers or weight loss, h/o cancer, IVDU, recent trauma. No concern for cauda equina, epidural abscess, or other serious cause of back pain. Conservative measures such as rest, ice/heat and pain medicine indicated with PCP follow-up if no improvement with conservative management.         Final Clinical Impression(s) / ED Diagnoses Final diagnoses:  Acute left-sided low back pain without sciatica    Rx / DC Orders ED Discharge Orders          Ordered    methocarbamol (ROBAXIN) 500 MG tablet  2 times daily        02/10/22 1758    HYDROcodone-acetaminophen (NORCO/VICODIN) 5-325 MG tablet  Every 6 hours PRN        02/10/22 1758    lidocaine (LIDODERM) 5 %        02/10/22 1758              Renne CriglerGeiple, Solon Alban, PA-C 02/10/22 1805    Cathren LaineSteinl, Kevin, MD 02/10/22 2142

## 2022-02-13 ENCOUNTER — Emergency Department (HOSPITAL_COMMUNITY)
Admission: EM | Admit: 2022-02-13 | Discharge: 2022-02-13 | Disposition: A | Discharge status: Home / Self Care | Payer: No Typology Code available for payment source | Attending: Emergency Medicine | Admitting: Emergency Medicine

## 2022-02-13 ENCOUNTER — Encounter (HOSPITAL_COMMUNITY): Payer: Self-pay

## 2022-02-13 ENCOUNTER — Other Ambulatory Visit: Payer: Self-pay

## 2022-02-13 ENCOUNTER — Emergency Department (HOSPITAL_COMMUNITY): Payer: No Typology Code available for payment source

## 2022-02-13 DIAGNOSIS — Z79899 Other long term (current) drug therapy: Secondary | ICD-10-CM | POA: Insufficient documentation

## 2022-02-13 DIAGNOSIS — I10 Essential (primary) hypertension: Secondary | ICD-10-CM | POA: Insufficient documentation

## 2022-02-13 DIAGNOSIS — M545 Low back pain, unspecified: Secondary | ICD-10-CM | POA: Insufficient documentation

## 2022-02-13 DIAGNOSIS — G8929 Other chronic pain: Secondary | ICD-10-CM | POA: Diagnosis not present

## 2022-02-13 DIAGNOSIS — Z7982 Long term (current) use of aspirin: Secondary | ICD-10-CM | POA: Diagnosis not present

## 2022-02-13 MED ORDER — LIDOCAINE HCL (PF) 1 % IJ SOLN
30.0000 mL | Freq: Once | INTRAMUSCULAR | Status: AC
  Administered 2022-02-13: 30 mL
  Filled 2022-02-13: qty 30

## 2022-02-13 MED ORDER — OXYCODONE-ACETAMINOPHEN 5-325 MG PO TABS
1.0000 | ORAL_TABLET | Freq: Once | ORAL | Status: AC
  Administered 2022-02-13: 1 via ORAL
  Filled 2022-02-13: qty 1

## 2022-02-13 NOTE — Discharge Instructions (Signed)
  At this time there does not appear to be the presence of an emergent medical condition, however there is always the potential for conditions to change. Please read and follow the below instructions.  Please return to the Emergency Department immediately for any new or worsening symptoms. Please be sure to follow up with your Primary Care Provider within one week regarding your visit today; please call their office to schedule an appointment even if you are feeling better for a follow-up visit. You received a trigger point injection to your left lower back with lidocaine.  Please inform your back specialist of this procedure.  Please monitor your symptoms, if you develop any infectious symptoms including fever, redness, swelling, drainage or other concerning symptoms please return immediately to the emergency department.  You may continue the use of your muscle relaxer Robaxin to help with your symptoms.  Go to the nearest Emergency Department immediately if: You have fever or chills You develop new bowel or bladder control problems. You have unusual weakness or numbness in your arms or legs. You feel faint. You have blood in your urine or pain when you pee You have abdominal pain nausea or vomiting You have any new/concerning or worsening of symptoms.    Please read the additional information packets attached to your discharge summary.  Do not take your medicine if  develop an itchy rash, swelling in your mouth or lips, or difficulty breathing; call 911 and seek immediate emergency medical attention if this occurs.  You may review your lab tests and imaging results in their entirety on your MyChart account.  Please discuss all results of fully with your primary care provider and other specialist at your follow-up visit.  Note: Portions of this text may have been transcribed using voice recognition software. Every effort was made to ensure accuracy; however, inadvertent computerized  transcription errors may still be present.

## 2022-02-13 NOTE — ED Provider Notes (Addendum)
[facility] DEPT Provider Note   CSN: [id number] Arrival date & time: 02/13/22  [id number]     History  Chief Complaint  Patient presents with   Back Pain    Reginald Burns Sr. is a 66 y.o. male History includes GERD, hypertension, hypercholesterolemia, migraines, chronic low back pain, sleep apnea.  Patient presents to the ER with his wife today for evaluation of left low back pain.  Patient reports a history of chronic left lower back pain ongoing for several years, current flareup began around 2-3 days ago he describes as severe spasming ache sensation which is moderate intensity will radiate down his left l left leg to his foot.  Pain worsens with movement and palpation.  Patient was in the ER 2 days ago and received medication for this without significant relief.  Patient ports pain is worsened over the past 2 days.  Of note patient had ablation done on January 01, 2022, left lumbar spine levels of L3-S1.  Patient reports he has had a numbness sensation of his left leg since that time it is unchanged over the past few days  Patient has fall/injury, fever, chills, abdominal pain, hematuria, saddle paresthesias, bowel/bladder incontinence, urinary retention, abnormal features to his low back pain or any additional concerns. HPI     Home Medications Prior to Admission medications   Medication Sig Start Date End Date Taking? Authorizing Provider  acetaminophen (TYLENOL) 500 MG tablet Take 1 tablet by mouth 3 (three) times daily as needed. 01/19/20   [provider]  aspirin EC 81 MG tablet Take 81 mg by mouth daily.    [provider]  atorvastatin (LIPITOR) 20 MG tablet Take 20 mg by mouth daily.    [provider]  diclofenac (VOLTAREN) 75 MG EC tablet Take 1 tablet (75 mg total) by mouth 2 (two) times daily. 09/04/19   Raylene Everts, MD  fluticasone [facility]) 50 MCG/ACT nasal spray INSTILL 1 SPRAY IN EACH NOSTRIL AT BEDTIME  01/19/22   [provider]  gabapentin (NEURONTIN) 300 MG capsule Take 1 capsule by mouth at bedtime. 04/15/21   [provider]  HYDROcodone-acetaminophen (NORCO/VICODIN) 5-325 MG tablet Take 1 tablet by mouth every 6 (six) hours as needed for severe pain. 02/10/22   Carlisle Cater, PA-C  lidocaine (LIDODERM) 5 % APPLY 1 PATCH TO SKIN ONCE DAILY (APPLY FOR 12 HOURS, THEN REMOVE FOR 12 HOURS) 02/10/22   Carlisle Cater, PA-C  lisinopril-hydrochlorothiazide (ZESTORETIC) 20-12.5 MG tablet Take 1 tablet by mouth 2 (two) times daily. 01/19/22   [provider]  loratadine (CLARITIN) 10 MG tablet Take 1 tablet by mouth daily. 07/22/21   [provider]  methocarbamol (ROBAXIN) 500 MG tablet Take 1 tablet (500 mg total) by mouth 2 (two) times daily. 02/10/22   Carlisle Cater, PA-C  nortriptyline (PAMELOR) 75 MG capsule TAKE ONE CAPSULE BY MOUTH AT BEDTIME AS NEEDED (DOSE INCREASE) 12/30/21   [provider]  omeprazole (PRILOSEC) 20 MG capsule Take 20 mg by mouth daily.    [provider]  tamsulosin (FLOMAX) 0.4 MG CAPS capsule Take by mouth. 04/01/21   [provider]  traMADol (ULTRAM) 50 MG tablet Take 50 mg by mouth 3 (three) times daily as needed. 08/13/21   [provider]  Oxcarbazepine (TRILEPTAL) 300 MG tablet Take 1 tablet (300 mg total) by mouth 2 (two) times daily. 04/12/17 09/04/19  Marcial Pacas, MD  pregabalin (LYRICA) 300 MG capsule Take 1 capsule (300 mg  total) by mouth 3 (three) times daily. 04/12/17 08/01/19  Marcial Pacas, MD      Allergies    Patient has no known allergies.    Review of Systems   Review of Systems Ten systems are reviewed and are negative for acute change except as noted in the HPI  Physical Exam Updated Vital Signs BP 125/83 (BP Location: Left Arm)   Pulse 98   Temp 98.2 F (36.8 C) (Oral)   Resp 16   Ht 5\' 11"  (1.803 m)   Wt 92.1 kg   SpO2 100%   BMI 28.31 kg/m  Physical Exam Constitutional:       General: He is not in acute distress.    Appearance: Normal appearance. He is well-developed. He is not ill-appearing or diaphoretic.  HENT:     Head: Normocephalic and atraumatic.  Eyes:     General: Vision grossly intact. Gaze aligned appropriately.     Pupils: Pupils are equal, round, and reactive to light.  Neck:     Trachea: Trachea and phonation normal.  Cardiovascular:     Rate and Rhythm: Normal rate and regular rhythm.  Pulmonary:     Effort: Pulmonary effort is normal. No respiratory distress.  Abdominal:     General: There is no distension.     Palpations: Abdomen is soft.     Tenderness: There is no abdominal tenderness. There is no guarding or rebound.  Musculoskeletal:        General: Normal range of motion.     Cervical back: Normal range of motion.     Comments: No midline spinal tenderness palpation.  No crepitus step-off or deformity spine.  No SI joint tenderness palpation.  Left lumbar paraspinal muscular tenderness palpation and palpable muscle spasm present.  No overlying skin changes/erythema.  No fluctuance or induration.  No increased warmth.  Positive left straight leg raise.  5/5 strength bilateral hip flexion, knee extension, knee flexion, EHL, dorsi/plantarflexion.  Negative clonus bilaterally.  1+ bilateral patella DTR.  Pedal pulses intact and equal.  Capillary fill and sensation intact bilaterally, patient reports some diminished sensation to the left foot secondary to nerve ablation 2 months ago.  Compartments soft.  Skin:    General: Skin is warm and dry.  Neurological:     Mental Status: He is alert.     GCS: GCS eye subscore is 4. GCS verbal subscore is 5. GCS motor subscore is 6.     Comments: Speech is clear and goal oriented, follows commands Major Cranial nerves without deficit, no facial droop Moves extremities without ataxia, coordination intact  Psychiatric:        Behavior: Behavior normal.    ED Results / Procedures / Treatments    Labs (all labs ordered are listed, but only abnormal results are displayed) Labs Reviewed - No data to display  EKG None  Radiology DG Lumbar Spine Complete  Result Date: 02/13/2022 CLINICAL DATA:  Left lower back pain with radiculopathy. EXAM: LUMBAR SPINE - COMPLETE 4+ VIEW COMPARISON:  August 01, 2019. FINDINGS: There is no evidence of lumbar spine fracture. Alignment is normal. Intervertebral disc spaces are maintained. Mild anterior osteophyte formation is noted at L2-3, L3-4 and L4-5. IMPRESSION: Mild multilevel degenerative changes as described above. No acute abnormality seen. Electronically Signed   By: Marijo Conception M.D.   On: 02/13/2022 07:50    Procedures Injection tendon or ligament  Date/Time: 02/13/2022 9:13 AM Performed by: Deliah Boston, PA-C Authorized by: Athena Masse,  Mayling Aber A, PA-C      PROCEDURE:  Trigger point injection of the left lumbar paraspinal musculature.  Verbal consent obtained from patient and his wife at bedside. Risk versus benefits and alternatives were discussed Consent given by patient and his wife. Patient stated understanding of procedure being performed. Patient's understanding of procedure matches consent Relevant documents present and verified Site marked Imaging studies available, x-ray of the lumbar spine Required items and equipment was available Patient is any confirmed verbally and by armband Timeout called immediately prior to procedure Skin was prepped in the usual sterile fashion.  Cleaning with alcohol swabs and chlorhexidine. No sedation Trigger point injection of the left lumbar paraspinal muscle spasm with 5 cc of 1% lidocaine without epinephrine.  Negative aspiration prior to injection.  Injection tolerated well without immediate postprocedural complications.  Band-Aid was applied.  Patient reports some relief of symptoms immediately after procedure. Close monitoring for side effects including infectious symptoms were  discussed at length with patient and his wife and they stated understanding.  Medications Ordered in ED Medications  oxyCODONE-acetaminophen (PERCOCET/ROXICET) 5-325 MG per tablet 1 tablet (1 tablet Oral Given 02/13/22 0741)  lidocaine (PF) (XYLOCAINE) 1 % injection 30 mL (30 mLs Other Given 02/13/22 0839)    ED Course/ Medical Decision Making/ A&P Clinical Course as of 02/13/22 0923  Fri Feb 13, 2022  0752 DG Lumbar Spine Complete I personally reviewed and interpreted patient's lumbar spine x-ray, 5 view.  Loss of lordotic curvature.  Diffuse degenerative disc disease and endplate spurring. No acute fracture/dislocation spondylolisthesis identified. [BM]    Clinical Course User Index [BM] Deliah Boston, PA-C                           Medical Decision Making Amount and/or Complexity of Data Reviewed Independent Historian: spouse    Details: Spouse at bedside provides supplemental information External Data Reviewed: notes.    Details: I reviewed notes from 01/01/2022, patient had lumbar radiofrequency denervation performed at the levels of L3-L4 L4-L5 and L5-S1 f by Dr. Posey Pronto   I reviewed ER note from 02/10/2022.  No imaging was performed at that visit as patient had no history of trauma and only had pain for 1 day at that point.  Patient received IM Toradol along with p.o. Robaxin and a Lidoderm patch.  Patient was found to have a back spasm.  He was given short prescription of hydrocodone-acetaminophen.  Patient was encouraged to follow-up with PCP. Radiology: ordered and independent interpretation performed. Decision-making details documented in ED Course.  Risk OTC drugs. Prescription drug management. Risk Details: Jyshawn Lascelles. is a 66 y.o. male presenting with left-sided back pain.  1.5 months ago he had nerve ablation performed in [place].  He reports some chronic paresthesias to his left leg since that time those have not changed.  Patient has a palpable  muscle spasm to the left lumbar paraspinal musculature, no overlying skin changes or evidence of infection.  Vital signs are within normal limits.  Patient has a steady unassisted gait.  Patient denies any history of immunocompromising diseases.  Low suspicion for abscess or infectious etiology of his symptoms at this time.  Symptoms are consistent reproducible.  No neurologic complaint.  Vital signs within normal limits.  He denies any red flags to suggest myelopathy, cauda equina, AAA, dissection, spinal dural abscess, kidney stone disease or other emergent causes of his low back pain at this  point.  X-ray of the lumbar spine was obtained today which shows degenerative disc disease which patient was aware of prior to arrival.  No acute fracture dislocation/spondylolisthesis was identified.  I discussed several treatment options with the patient today, shared decision made with patient and his wife, they elected to proceed with a trigger point injection of the left lumbar paraspinal musculature, patient tolerated this well as documented above.  We discussed observation for side effects as patient returns home.  They have reached out the patient's back specialist today and awaiting a callback for further evaluation and treatment.  Patient will continue use of Robaxin.  Patient reassessed at discharge and reports pain is improving after trigger point injection.    At this time there does not appear to be any evidence of an acute emergency medical condition and the patient appears stable for discharge with appropriate outpatient follow up. Diagnosis was discussed with patient who verbalizes understanding of care plan and is agreeable to discharge. I have discussed return precautions with patient and wife who verbalizes understanding. Patient encouraged to follow-up with their PCP and back specialist. All questions answered.  Patient's case discussed with Dr. Maryan Rued who agrees with plan of Trigger point  injection and discharge with follow-up.   Note: Portions of this report may have been transcribed using voice recognition software. Every effort was made to ensure accuracy; however, inadvertent computerized transcription errors may still be present.         Final Clinical Impression(s) / ED Diagnoses Final diagnoses:  Chronic left-sided low back pain, unspecified whether sciatica present    Rx / DC Orders ED Discharge Orders     None         Deliah Boston, PA-C 02/13/22 [id number]    Deliah Boston, PA-C 02/13/22 [id number]    Blanchie Dessert, MD 02/19/22 [phone]

## 2022-02-13 NOTE — ED Triage Notes (Signed)
Pt reports to ED from home with c/o left side back pain ongoing for awhile. Pt denies any urinary issues. Seen 05/30 for same.

## 2022-02-15 ENCOUNTER — Encounter (HOSPITAL_COMMUNITY): Payer: Self-pay

## 2022-02-15 ENCOUNTER — Other Ambulatory Visit: Payer: Self-pay

## 2022-02-15 ENCOUNTER — Emergency Department (HOSPITAL_COMMUNITY)
Admission: EM | Admit: 2022-02-15 | Discharge: 2022-02-15 | Disposition: A | Discharge status: Home / Self Care | Payer: No Typology Code available for payment source | Attending: Emergency Medicine | Admitting: Emergency Medicine

## 2022-02-15 DIAGNOSIS — Z79899 Other long term (current) drug therapy: Secondary | ICD-10-CM | POA: Insufficient documentation

## 2022-02-15 DIAGNOSIS — Z7982 Long term (current) use of aspirin: Secondary | ICD-10-CM | POA: Insufficient documentation

## 2022-02-15 DIAGNOSIS — M545 Low back pain, unspecified: Secondary | ICD-10-CM | POA: Diagnosis present

## 2022-02-15 DIAGNOSIS — M62830 Muscle spasm of back: Secondary | ICD-10-CM | POA: Diagnosis not present

## 2022-02-15 DIAGNOSIS — I10 Essential (primary) hypertension: Secondary | ICD-10-CM | POA: Diagnosis not present

## 2022-02-15 MED ORDER — LIDOCAINE 5 % EX PTCH
1.0000 | MEDICATED_PATCH | CUTANEOUS | Status: DC
  Administered 2022-02-15: 1 via TRANSDERMAL
  Filled 2022-02-15: qty 1

## 2022-02-15 MED ORDER — KETOROLAC TROMETHAMINE 15 MG/ML IJ SOLN
15.0000 mg | Freq: Once | INTRAMUSCULAR | Status: AC
  Administered 2022-02-15: 15 mg via INTRAMUSCULAR
  Filled 2022-02-15: qty 1

## 2022-02-15 MED ORDER — DIAZEPAM 5 MG PO TABS
5.0000 mg | ORAL_TABLET | Freq: Once | ORAL | Status: AC
  Administered 2022-02-15: 5 mg via ORAL
  Filled 2022-02-15: qty 1

## 2022-02-15 MED ORDER — KETOROLAC TROMETHAMINE 15 MG/ML IJ SOLN: 15.0000 mg | Freq: Once | INTRAMUSCULAR | Status: DC

## 2022-02-15 NOTE — ED Triage Notes (Signed)
Patient complaining of left sided back pain and spasms since Tuesday. Was seen on 6/2 for the same.

## 2022-02-15 NOTE — Discharge Instructions (Addendum)
Your exam today was reassuring.  You received medications in the emergency room which improved your pain.  Continue taking muscle relaxer for your muscle spasm.  I have provided you with orthopedic referral as listed above.  If you want to be evaluated sooner for this back pain and you do not have any worsening symptoms such as weakness, fever, numbness or tingling in your groin, difficulty controlling your bladder or bowel you could follow-up with the walk-in orthopedic urgent care called [facility] is located on [address].  You received a trigger point injection 2 days ago however that is something we do not traditionally do in the emergency room and you could check with the urgent care before you visit them to see if that something they offer.

## 2022-02-15 NOTE — ED Provider Notes (Signed)
[facility]-EMERGENCY DEPT Provider Note   CSN: [id number] Arrival date & time: 02/15/22  0420     History  Chief Complaint  Patient presents with   Back Pain    Reginald Cos Sr. is a 66 y.o. male.  66 year old male presents today for evaluation of ongoing left-sided back pain since Monday.  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  He has had back pain since.  He feels tightness, "a knot" in his left low back.  Denies back trauma, difficulty with bowel, or bladder control, saddle anesthesia, weakness, history of malignancy, history of IV drug use.  This is his third visit to the emergency room for same complaint in the past week.  He had a trigger point injection done 2 days ago which she stated provided him with significant relief however his pain returned and was hoping to get this injection again today.  He does have a muscle relaxer at home which he has been using sparingly.  He states he has been using Salonpas which has been helping.  He was prescribed lidocaine patches however he states those were too expensive and he was unable to pick these up.  The history is provided by the patient. No language interpreter was used.      Home Medications Prior to Admission medications   Medication Sig Start Date End Date Taking? Authorizing Provider  acetaminophen (TYLENOL) 500 MG tablet Take 1 tablet by mouth 3 (three) times daily as needed. 01/19/20   [provider]  aspirin EC 81 MG tablet Take 81 mg by mouth daily.    [provider]  atorvastatin (LIPITOR) 20 MG tablet Take 20 mg by mouth daily.    [provider]  diclofenac (VOLTAREN) 75 MG EC tablet Take 1 tablet (75 mg total) by mouth 2 (two) times daily. 09/04/19   Eustace Moore, MD  fluticasone [facility]) 50 MCG/ACT nasal spray INSTILL 1 SPRAY IN EACH NOSTRIL AT BEDTIME 01/19/22   [provider]  gabapentin (NEURONTIN) 300 MG capsule Take 1  capsule by mouth at bedtime. 04/15/21   [provider]  HYDROcodone-acetaminophen (NORCO/VICODIN) 5-325 MG tablet Take 1 tablet by mouth every 6 (six) hours as needed for severe pain. 02/10/22   Renne Crigler, PA-C  lidocaine (LIDODERM) 5 % APPLY 1 PATCH TO SKIN ONCE DAILY (APPLY FOR 12 HOURS, THEN REMOVE FOR 12 HOURS) 02/10/22   Renne Crigler, PA-C  lisinopril-hydrochlorothiazide (ZESTORETIC) 20-12.5 MG tablet Take 1 tablet by mouth 2 (two) times daily. 01/19/22   [provider]  loratadine (CLARITIN) 10 MG tablet Take 1 tablet by mouth daily. 07/22/21   [provider]  methocarbamol (ROBAXIN) 500 MG tablet Take 1 tablet (500 mg total) by mouth 2 (two) times daily. 02/10/22   Renne Crigler, PA-C  nortriptyline (PAMELOR) 75 MG capsule TAKE ONE CAPSULE BY MOUTH AT BEDTIME AS NEEDED (DOSE INCREASE) 12/30/21   [provider]  omeprazole (PRILOSEC) 20 MG capsule Take 20 mg by mouth daily.    [provider]  tamsulosin (FLOMAX) 0.4 MG CAPS capsule Take by mouth. 04/01/21   [provider]  traMADol (ULTRAM) 50 MG tablet Take 50 mg by mouth 3 (three) times daily as needed. 08/13/21   [provider]  Oxcarbazepine (TRILEPTAL) 300 MG tablet Take 1 tablet (300 mg total) by mouth 2 (two) times daily. 04/12/17 09/04/19  Levert Feinstein, MD  pregabalin (LYRICA) 300 MG capsule Take 1 capsule (300  mg total) by mouth 3 (three) times daily. 04/12/17 08/01/19  Levert Feinstein, MD      Allergies    Patient has no known allergies.    Review of Systems   Review of Systems  Constitutional:  Negative for chills and fever.  Respiratory:  Negative for shortness of breath.   Gastrointestinal:  Negative for abdominal pain.  Genitourinary:  Negative for difficulty urinating.  Musculoskeletal:  Positive for back pain. Negative for gait problem.  Neurological:  Negative for weakness and numbness.  All other systems reviewed and are negative.  Physical Exam Updated  Vital Signs BP (!) 146/106 (BP Location: Right Arm)   Pulse 89   Temp 97.7 F (36.5 C) (Oral)   Resp 14   Ht 5\' 11"  (1.803 m)   Wt 92.1 kg   SpO2 100%   BMI 28.31 kg/m  Physical Exam Vitals and nursing note reviewed.  Constitutional:      General: He is not in acute distress.    Appearance: Normal appearance. He is not ill-appearing.  HENT:     Head: Normocephalic and atraumatic.     Nose: Nose normal.  Eyes:     Conjunctiva/sclera: Conjunctivae normal.  Pulmonary:     Effort: Pulmonary effort is normal. No respiratory distress.  Musculoskeletal:        General: No deformity.     Comments: Cervical, thoracic, lumbar spine without tenderness to palpation.  Left lumbar paraspinal muscle significant for tightness and muscle spasm.  Significant tenderness to palpation present of left lumbar paraspinal region.  Full range of motion in bilateral lower extremities with intact strength in extensor and flexor muscle groups.  Sensation intact and symmetrical.  Lumbar flexion limited secondary to pain.  Lateral flexion preserved.  Skin:    Findings: No rash.  Neurological:     Mental Status: He is alert.    ED Results / Procedures / Treatments   Labs (all labs ordered are listed, but only abnormal results are displayed) Labs Reviewed - No data to display  EKG None  Radiology No results found.  Procedures Procedures    Medications Ordered in ED Medications - No data to display  ED Course/ Medical Decision Making/ A&P Clinical Course as of 02/15/22 0941  Sun Feb 15, 2022  0929 Patient with significant improvement in his back pain following the pain regimen.  He is resting comfortably with his eyes closed.  Wife is at bedside. [AA]    Clinical Course User Index [AA] Feb 17, 2022, PA-C                           Medical Decision Making Risk Prescription drug management.   Medical Decision Making / ED Course   This patient presents to the ED for concern of back pain,  this involves an extensive number of treatment options, and is a complaint that carries with it a high risk of complications and morbidity.  The differential diagnosis includes muscle strain, muscle spasm, cauda equina syndrome, spinal epidural abscess  MDM: Mr. Hamme is a 66 year old male who presents today for evaluation of low back pain.  Exam significant for spasm of the left lumbar paraspinal region.  Similar findings on previous ER visits.  Most recently received a trigger point injection with significant improvement.  Symptoms returned today.  Has muscle relaxer which she has been using sporadically at home.  Was unable to for lidocaine patch and has not been using it.  Chinita PesterSalonpas has been helping.  Patient is overall well-appearing on exam.  Without red flag signs or symptoms concerning for cauda equina syndrome, or spinal epidural abscess.  Will provide Toradol, Valium, lidocaine patch and reevaluate.  Will provide with sports medicine follow-up. Significant improvement in symptoms following pain regimen.  Patient is appropriate for discharge.  Discharged in stable condition.  Return precautions discussed at length.  Doubt spinal epidural abscess or cauda equina given lack of red flag signs or symptoms, and without spinal tenderness..  Patient and wife voiced understanding and are in agreement with plan.  Condition follow-up provided.    Lab Tests: -I ordered, reviewed, and interpreted labs.   The pertinent results include:   Labs Reviewed - No data to display    EKG  EKG Interpretation  Date/Time:    Ventricular Rate:    PR Interval:    QRS Duration:   QT Interval:    QTC Calculation:   R Axis:     Text Interpretation:          Medicines ordered and prescription drug management: Meds ordered this encounter  Medications   lidocaine (LIDODERM) 5 % 1 patch   diazepam (VALIUM) tablet 5 mg   ketorolac (TORADOL) 15 MG/ML injection 15 mg    -I have reviewed the patients home  medicines and have made adjustments as needed  Co morbidities that complicate the patient evaluation  Past Medical History:  Diagnosis Date   Anxiety and depression    GERD (gastroesophageal reflux disease)    Hypercholesteremia    Hypertension    Left facial pain    Migraine    PTSD (post-traumatic stress disorder)       Dispostion: Patient stable for discharge.  Discharged in stable condition.  Return precautions discussed   Final Clinical Impression(s) / ED Diagnoses Final diagnoses:  Acute left-sided low back pain without sciatica  Muscle spasm of back    Rx / DC Orders ED Discharge Orders     None         Marita Kansasli, Kweli Grassel, PA-C 02/15/22 0946    Virgina Norfolkuratolo, Adam, DO 02/15/22 1432

## 2022-02-27 DIAGNOSIS — M545 Low back pain, unspecified: Secondary | ICD-10-CM | POA: Diagnosis not present

## 2022-03-02 ENCOUNTER — Telehealth: Payer: Self-pay | Admitting: Family Medicine

## 2022-03-02 NOTE — Telephone Encounter (Signed)
Copied from CRM [id number]. Topic: Medicare AWV >> Mar 02, 2022  1:18 PM Payton Doughty wrote: Reason for CRM: Left message for patient to schedule Annual Wellness Visit.  Please schedule with Nurse Health Advisor Lanier Ensign, RN at [facility].  Please call [phone] ask for [facility]

## 2022-03-13 DIAGNOSIS — M5459 Other low back pain: Secondary | ICD-10-CM | POA: Diagnosis not present

## 2022-03-26 ENCOUNTER — Ambulatory Visit (INDEPENDENT_AMBULATORY_CARE_PROVIDER_SITE_OTHER): Payer: Medicare HMO

## 2022-03-26 DIAGNOSIS — Z0000 Encounter for general adult medical examination without abnormal findings: Secondary | ICD-10-CM | POA: Diagnosis not present

## 2022-03-26 NOTE — Patient Instructions (Signed)
Reginald Burke , Thank you for taking time to come for your Medicare Wellness Visit. I appreciate your ongoing commitment to your health goals. Please review the following plan we discussed and let me know if I can assist you in the future.   Screening recommendations/referrals: Colonoscopy: pt stated scheduled in [place]  Recommended yearly ophthalmology/optometry visit for glaucoma screening and checkup Recommended yearly dental visit for hygiene and checkup  Vaccinations: Influenza vaccine: done 08/12/21 repeat every year  Pneumococcal vaccine: Up to date Tdap vaccine: done 07/23/16 repeat every 10 years  Shingles vaccine: Shingrix discussed. Please contact your pharmacy for coverage information.    Covid-19: Completed 3/29, 4/20, 08/04/20 & 02/20/21  Advanced directives: Advance directive discussed with you today. I have provided a copy for you to complete at home and have notarized. Once this is complete please bring a copy in to our office so we can scan it into your chart.  Conditions/risks identified: get rid of back pain   Next appointment: Follow up in one year for your annual wellness visit.   Preventive Care 67 Years and Older, Male Preventive care refers to lifestyle choices and visits with your health care provider that can promote health and wellness. What does preventive care include? A yearly physical exam. This is also called an annual well check. Dental exams once or twice a year. Routine eye exams. Ask your health care provider how often you should have your eyes checked. Personal lifestyle choices, including: Daily care of your teeth and gums. Regular physical activity. Eating a healthy diet. Avoiding tobacco and drug use. Limiting alcohol use. Practicing safe sex. Taking low doses of aspirin every day. Taking vitamin and mineral supplements as recommended by your health care provider. What happens during an annual well check? The services and screenings done by  your health care provider during your annual well check will depend on your age, overall health, lifestyle risk factors, and family history of disease. Counseling  Your health care provider may ask you questions about your: Alcohol use. Tobacco use. Drug use. Emotional well-being. Home and relationship well-being. Sexual activity. Eating habits. History of falls. Memory and ability to understand (cognition). Work and work Astronomer. Screening  You may have the following tests or measurements: Height, weight, and BMI. Blood pressure. Lipid and cholesterol levels. These may be checked every 5 years, or more frequently if you are over 110 years old. Skin check. Lung cancer screening. You may have this screening every year starting at age 67 if you have a 30-pack-year history of smoking and currently smoke or have quit within the past 15 years. Fecal occult blood test (FOBT) of the stool. You may have this test every year starting at age 67. Flexible sigmoidoscopy or colonoscopy. You may have a sigmoidoscopy every 5 years or a colonoscopy every 10 years starting at age 67. Prostate cancer screening. Recommendations will vary depending on your family history and other risks. Hepatitis C blood test. Hepatitis B blood test. Sexually transmitted disease (STD) testing. Diabetes screening. This is done by checking your blood sugar (glucose) after you have not eaten for a while (fasting). You may have this done every 1-3 years. Abdominal aortic aneurysm (AAA) screening. You may need this if you are a current or former smoker. Osteoporosis. You may be screened starting at age 42 if you are at high risk. Talk with your health care provider about your test results, treatment options, and if necessary, the need for more tests. Vaccines  Your health  care provider may recommend certain vaccines, such as: Influenza vaccine. This is recommended every year. Tetanus, diphtheria, and acellular pertussis  (Tdap, Td) vaccine. You may need a Td booster every 10 years. Zoster vaccine. You may need this after age 59. Pneumococcal 13-valent conjugate (PCV13) vaccine. One dose is recommended after age 11. Pneumococcal polysaccharide (PPSV23) vaccine. One dose is recommended after age 27. Talk to your health care provider about which screenings and vaccines you need and how often you need them. This information is not intended to replace advice given to you by your health care provider. Make sure you discuss any questions you have with your health care provider. Document Released: 09/27/2015 Document Revised: 05/20/2016 Document Reviewed: 07/02/2015 Elsevier Interactive Patient Education  2017 [place] Prevention in the Home Falls can cause injuries. They can happen to people of all ages. There are many things you can do to make your home safe and to help prevent falls. What can I do on the outside of my home? Regularly fix the edges of walkways and driveways and fix any cracks. Remove anything that might make you trip as you walk through a door, such as a raised step or threshold. Trim any bushes or trees on the path to your home. Use bright outdoor lighting. Clear any walking paths of anything that might make someone trip, such as rocks or tools. Regularly check to see if handrails are loose or broken. Make sure that both sides of any steps have handrails. Any raised decks and porches should have guardrails on the edges. Have any leaves, snow, or ice cleared regularly. Use sand or salt on walking paths during winter. Clean up any spills in your garage right away. This includes oil or grease spills. What can I do in the bathroom? Use night lights. Install grab bars by the toilet and in the tub and shower. Do not use towel bars as grab bars. Use non-skid mats or decals in the tub or shower. If you need to sit down in the shower, use a plastic, non-slip stool. Keep the floor dry. Clean  up any water that spills on the floor as soon as it happens. Remove soap buildup in the tub or shower regularly. Attach bath mats securely with double-sided non-slip rug tape. Do not have throw rugs and other things on the floor that can make you trip. What can I do in the bedroom? Use night lights. Make sure that you have a light by your bed that is easy to reach. Do not use any sheets or blankets that are too big for your bed. They should not hang down onto the floor. Have a firm chair that has side arms. You can use this for support while you get dressed. Do not have throw rugs and other things on the floor that can make you trip. What can I do in the kitchen? Clean up any spills right away. Avoid walking on wet floors. Keep items that you use a lot in easy-to-reach places. If you need to reach something above you, use a strong step stool that has a grab bar. Keep electrical cords out of the way. Do not use floor polish or wax that makes floors slippery. If you must use wax, use non-skid floor wax. Do not have throw rugs and other things on the floor that can make you trip. What can I do with my stairs? Do not leave any items on the stairs. Make sure that there are handrails on  both sides of the stairs and use them. Fix handrails that are broken or loose. Make sure that handrails are as long as the stairways. Check any carpeting to make sure that it is firmly attached to the stairs. Fix any carpet that is loose or worn. Avoid having throw rugs at the top or bottom of the stairs. If you do have throw rugs, attach them to the floor with carpet tape. Make sure that you have a light switch at the top of the stairs and the bottom of the stairs. If you do not have them, ask someone to add them for you. What else can I do to help prevent falls? Wear shoes that: Do not have high heels. Have rubber bottoms. Are comfortable and fit you well. Are closed at the toe. Do not wear sandals. If you  use a stepladder: Make sure that it is fully opened. Do not climb a closed stepladder. Make sure that both sides of the stepladder are locked into place. Ask someone to hold it for you, if possible. Clearly mark and make sure that you can see: Any grab bars or handrails. First and last steps. Where the edge of each step is. Use tools that help you move around (mobility aids) if they are needed. These include: Canes. Walkers. Scooters. Crutches. Turn on the lights when you go into a dark area. Replace any light bulbs as soon as they burn out. Set up your furniture so you have a clear path. Avoid moving your furniture around. If any of your floors are uneven, fix them. If there are any pets around you, be aware of where they are. Review your medicines with your doctor. Some medicines can make you feel dizzy. This can increase your chance of falling. Ask your doctor what other things that you can do to help prevent falls. This information is not intended to replace advice given to you by your health care provider. Make sure you discuss any questions you have with your health care provider. Document Released: 06/27/2009 Document Revised: 02/06/2016 Document Reviewed: 10/05/2014 Elsevier Interactive Patient Education  2017 [employer].

## 2022-03-26 NOTE — Progress Notes (Signed)
This encounter was created in error - please disregard.

## 2022-03-26 NOTE — Progress Notes (Addendum)
Virtual Visit via Telephone Note  I connected with  Reginald Cos Sr. on 03/26/22 at  3:00 PM EDT by telephone and verified that I am speaking with the correct person using two identifiers.  Medicare Annual Wellness visit completed telephonically due to Covid-19 pandemic.   Persons participating in this call: This Health Coach and this patient. And wife sylvia   Location: Patient: home Provider: office   I discussed the limitations, risks, security and privacy concerns of performing an evaluation and management service by telephone and the availability of in person appointments. The patient expressed understanding and agreed to proceed.  Unable to perform video visit due to video visit attempted and failed and/or patient does not have video capability.   Some vital signs may be absent or patient reported.   Marzella Schlein, LPN   Subjective:   Reginald Cos Sr. is a 66 y.o. male who presents for an Initial Medicare Annual Wellness Visit.  Review of Systems     Cardiac Risk Factors include: advanced age (>49men, >64 women);hypertension;dyslipidemia;male gender;smoking/ tobacco exposure     Objective:    There were no vitals filed for this visit. There is no height or weight on file to calculate BMI.     03/26/2022    3:30 PM 02/15/2022    4:35 AM 02/13/2022    6:32 AM 02/10/2022    4:06 PM 03/16/2021    7:10 AM 08/18/2019    3:58 PM 08/11/2019   11:48 AM  Advanced Directives  Does Patient Have a Medical Advance Directive? No No No No No No No  Would patient like information on creating a medical advance directive? Yes (MAU/Ambulatory/Procedural Areas - Information given) No - Patient declined No - Patient declined No - Patient declined  No - Patient declined No - Patient declined    Current Medications (verified) Outpatient Encounter Medications as of 03/26/2022  Medication Sig   acetaminophen (TYLENOL) 500 MG tablet Take 1 tablet by mouth 3 (three) times daily as  needed.   aspirin EC 81 MG tablet Take 81 mg by mouth daily.   atorvastatin (LIPITOR) 20 MG tablet Take 20 mg by mouth daily.   diclofenac (VOLTAREN) 75 MG EC tablet Take 1 tablet (75 mg total) by mouth 2 (two) times daily.   fluticasone (FLONASE) 50 MCG/ACT nasal spray INSTILL 1 SPRAY IN EACH NOSTRIL AT BEDTIME   gabapentin (NEURONTIN) 300 MG capsule Take 1 capsule by mouth at bedtime.   HYDROcodone-acetaminophen (NORCO/VICODIN) 5-325 MG tablet Take 1 tablet by mouth every 6 (six) hours as needed for severe pain.   lidocaine (LIDODERM) 5 % APPLY 1 PATCH TO SKIN ONCE DAILY (APPLY FOR 12 HOURS, THEN REMOVE FOR 12 HOURS)   lisinopril-hydrochlorothiazide (ZESTORETIC) 20-12.5 MG tablet Take 1 tablet by mouth 2 (two) times daily.   loratadine (CLARITIN) 10 MG tablet Take 1 tablet by mouth daily.   methocarbamol (ROBAXIN) 500 MG tablet Take 1 tablet (500 mg total) by mouth 2 (two) times daily.   nortriptyline (PAMELOR) 75 MG capsule TAKE ONE CAPSULE BY MOUTH AT BEDTIME AS NEEDED (DOSE INCREASE)   omeprazole (PRILOSEC) 20 MG capsule Take 20 mg by mouth daily.   tamsulosin (FLOMAX) 0.4 MG CAPS capsule Take by mouth.   traMADol (ULTRAM) 50 MG tablet Take 50 mg by mouth 3 (three) times daily as needed.   [DISCONTINUED] Oxcarbazepine (TRILEPTAL) 300 MG tablet Take 1 tablet (300 mg total) by mouth 2 (two) times daily.   [DISCONTINUED] pregabalin (LYRICA) 300  MG capsule Take 1 capsule (300 mg total) by mouth 3 (three) times daily.   No facility-administered encounter medications on file as of 03/26/2022.    Allergies (verified) Patient has no known allergies.   History: Past Medical History:  Diagnosis Date   Anxiety and depression    GERD (gastroesophageal reflux disease)    Hypercholesteremia    Hypertension    Left facial pain    Migraine    PTSD (post-traumatic stress disorder)    History reviewed. No pertinent surgical history. Family History  Problem Relation Age of Onset   Diabetes  Mother    Hypertension Mother    Kidney failure Mother    Hypertension Father    Prostate cancer Father    Diabetes Sister    Cancer Paternal Grandfather        Lung   Social History   Socioeconomic History   Marital status: Married    Spouse name: Not on file   Number of children: 2   Years of education: HS   Highest education level: Not on file  Occupational History   Occupation: Disabled  Tobacco Use   Smoking status: Every Day    Packs/day: 1.00    Types: Cigarettes   Smokeless tobacco: Never  Vaping Use   Vaping Use: Never used  Substance and Sexual Activity   Alcohol use: No    Comment: Quit in 2018   Drug use: Not Currently    Types: "Crack" cocaine, Marijuana   Sexual activity: Yes  Other Topics Concern   Not on file  Social History Narrative   Lives at home with wife and two sons.   Right-handed.   3 cups caffeine per day.   Social Determinants of Health   Financial Resource Strain: Low Risk  (03/26/2022)   Overall Financial Resource Strain (CARDIA)    Difficulty of Paying Living Expenses: Not hard at all  Food Insecurity: No Food Insecurity (03/26/2022)   Hunger Vital Sign    Worried About Running Out of Food in the Last Year: Never true    Ran Out of Food in the Last Year: Never true  Transportation Needs: No Transportation Needs (03/26/2022)   PRAPARE - Administrator, Civil Service (Medical): No    Lack of Transportation (Non-Medical): No  Physical Activity: Insufficiently Active (03/26/2022)   Exercise Vital Sign    Days of Exercise per Week: 1 day    Minutes of Exercise per Session: 30 min  Stress: No Stress Concern Present (03/26/2022)   [employer] of Occupational Health - Occupational Stress Questionnaire    Feeling of Stress : Not at all  Social Connections: Moderately Integrated (03/26/2022)   Social Connection and Isolation Panel [NHANES]    Frequency of Communication with Friends and Family: Twice a week    Frequency of  Social Gatherings with Friends and Family: Three times a week    Attends Religious Services: More than 4 times per year    Active Member of Clubs or Organizations: No    Attends Banker Meetings: Never    Marital Status: Married    Tobacco Counseling Ready to quit: Not Answered Counseling given: Not Answered   Clinical Intake:  Pre-visit preparation completed: Yes  Pain : No/denies pain     Diabetes: No  How often do you need to have someone help you when you read instructions, pamphlets, or other written materials from your doctor or pharmacy?: 1 - Never  Diabetic?no  Interpreter  Needed?: No  Information entered by :: Lanier Ensign, LPN   Activities of Daily Living    03/26/2022    3:31 PM  In your present state of health, do you have any difficulty performing the following activities:  Hearing? 1  Vision? 0  Difficulty concentrating or making decisions? 0  Walking or climbing stairs? 1  Comment at times  Dressing or bathing? 0  Doing errands, shopping? 0  Preparing Food and eating ? N  Using the Toilet? N  In the past six months, have you accidently leaked urine? N  Do you have problems with loss of bowel control? N  Managing your Medications? N  Managing your Finances? N  Housekeeping or managing your Housekeeping? N    Patient Care Team: Jeani Sow, MD as PCP - General (Family Medicine) Nelda Marseille, DMD (Oral Surgery) Allena Katz, Janus Delene Ruffini, MD as Referring Physician (Anesthesiology) Flynt, Shaune Pollack, MD as Referring Physician (Pain Medicine)  Indicate any recent Medical Services you may have received from other than [facility] providers in the past year (date may be approximate).     Assessment:   This is a routine wellness examination for [place].  Hearing/Vision screen Hearing Screening - Comments:: Pt wears hearing aid at times  [facility]   Dietary issues and exercise  activities discussed: Current Exercise Habits: Home exercise routine, Type of exercise: Other - see comments, Time (Minutes): 30, Frequency (Times/Week): 1, Weekly Exercise (Minutes/Week): 30   Goals Addressed             This Visit's Progress    Patient Stated       Want back to feel better        Depression Screen    03/26/2022    3:28 PM 01/28/2022    2:26 PM 09/26/2021    3:20 PM  PHQ 2/9 Scores  PHQ - 2 Score 0 4 1  PHQ- 9 Score 0 13     Fall Risk    03/26/2022    3:31 PM 01/28/2022    1:50 PM 09/26/2021    3:20 PM 03/01/2017   11:42 AM  Fall Risk   Falls in the past year? 0 0 0 No  Number falls in past yr: 0 0 0   Injury with Fall? 0 0 0   Risk for fall due to : Impaired vision Other (Comment) History of fall(s)   Risk for fall due to: Comment  uses a cane    Follow up Falls prevention discussed  Falls evaluation completed     FALL RISK PREVENTION PERTAINING TO THE HOME:  Any stairs in or around the home? Yes  If so, are there any without handrails? No  Home free of loose throw rugs in walkways, pet beds, electrical cords, etc? Yes  Adequate lighting in your home to reduce risk of falls? Yes   ASSISTIVE DEVICES UTILIZED TO PREVENT FALLS:  Life alert? No  Use of a cane, walker or w/c? Yes  Grab bars in the bathroom? No  Shower chair or bench in shower? No  Elevated toilet seat or a handicapped toilet? No   TIMED UP AND GO:  Was the test performed? No .   Cognitive Function:        03/26/2022    3:33 PM  6CIT Screen  What Year? 0 points  What month? 0 points  What time? 0 points  Count back from 20 0 points  Months in reverse 2 points  Repeat phrase 2 points  Total Score 4 points    Immunizations Immunization History  Administered Date(s) Administered   Fluad Quad(high Dose 65+) 10/08/2020, 08/12/2021   H1N1 09/14/2008   Influenza-Unspecified 08/12/2021   Moderna Covid-19 Vaccine Bivalent Booster 65yrs & up 08/02/2021   PFIZER(Purple  Top)SARS-COV-2 Vaccination 12/11/2019, 01/02/2020, 08/04/2020, 02/20/2021   PNEUMOCOCCAL CONJUGATE-20 09/26/2021   Pneumococcal Polysaccharide-23 11/25/2011   Tdap 07/23/2010, 07/23/2016   Zoster, Live 08/28/2015    TDAP status: Up to date  Flu Vaccine status: Up to date  Pneumococcal vaccine status: Up to date  Covid-19 vaccine status: Completed vaccines  Qualifies for Shingles Vaccine? Yes   Zostavax completed No   Shingrix Completed?: No.    Education has been provided regarding the importance of this vaccine. Patient has been advised to call insurance company to determine out of pocket expense if they have not yet received this vaccine. Advised may also receive vaccine at local pharmacy or Health Dept. Verbalized acceptance and understanding.  Screening Tests Health Maintenance  Topic Date Due   Hepatitis C Screening  Never done   Zoster Vaccines- Shingrix (1 of 2) Never done   COLONOSCOPY (Pts 45-94yrs Insurance coverage will need to be confirmed)  09/19/2014   INFLUENZA VACCINE  04/14/2022   TETANUS/TDAP  07/23/2026   Pneumonia Vaccine 44+ Years old  Completed   COVID-19 Vaccine  Completed   HPV VACCINES  Aged Out    Health Maintenance  Health Maintenance Due  Topic Date Due   Hepatitis C Screening  Never done   Zoster Vaccines- Shingrix (1 of 2) Never done   COLONOSCOPY (Pts 45-21yrs Insurance coverage will need to be confirmed)  09/19/2014    Pt stated colonoscopy is scheduled    Additional Screening:  Hepatitis C Screening: does qualify;  Vision Screening: Recommended annual ophthalmology exams for early detection of glaucoma and other disorders of the eye. Is the patient up to date with their annual eye exam?  Yes  Who is the provider or what is the name of the office in which the patient attends [facility] exams[facility] If pt is not established with a provider, would they like to be referred to a provider to establish care? No .   Dental Screening:  Recommended annual dental exams for proper oral hygiene  Community Resource Referral / Chronic Care Management: CRR required this visit?  No   CCM required this visit?  No      Plan:     I have personally reviewed and noted the following in the patient's chart:   Medical and social history Use of alcohol, tobacco or illicit drugs  Current medications and supplements including opioid prescriptions. Patient is currently taking opioid prescriptions. Information provided to patient regarding non-opioid alternatives. Patient advised to discuss non-opioid treatment plan with their provider. Functional ability and status Nutritional status Physical activity Advanced directives List of other physicians Hospitalizations, surgeries, and ER visits in previous 12 months Vitals Screenings to include cognitive, depression, and falls Referrals and appointments  In addition, I have reviewed and discussed with patient certain preventive protocols, quality metrics, and best practice recommendations. A written personalized care plan for preventive services as well as general preventive health recommendations were provided to patient.     Marzella Schlein, LPN   [id number]/61/0960   Nurse Notes: None

## 2022-03-26 NOTE — Telephone Encounter (Addendum)
Caller states they were expecting a phone call about medicare at 3:00pm today 07/13.  Caller states she did not know the appointment for 07/13 had been cancelled.  Please call back with follow up information.

## 2022-05-14 ENCOUNTER — Other Ambulatory Visit: Payer: Self-pay

## 2022-05-14 ENCOUNTER — Emergency Department (HOSPITAL_COMMUNITY)
Admission: EM | Admit: 2022-05-14 | Discharge: 2022-05-14 | Disposition: A | Discharge status: Home / Self Care | Payer: No Typology Code available for payment source | Attending: Emergency Medicine | Admitting: Emergency Medicine

## 2022-05-14 ENCOUNTER — Encounter (HOSPITAL_COMMUNITY): Payer: Self-pay

## 2022-05-14 DIAGNOSIS — X58XXXA Exposure to other specified factors, initial encounter: Secondary | ICD-10-CM | POA: Insufficient documentation

## 2022-05-14 DIAGNOSIS — S39012A Strain of muscle, fascia and tendon of lower back, initial encounter: Secondary | ICD-10-CM | POA: Diagnosis not present

## 2022-05-14 DIAGNOSIS — Z7982 Long term (current) use of aspirin: Secondary | ICD-10-CM | POA: Diagnosis not present

## 2022-05-14 DIAGNOSIS — S3992XA Unspecified injury of lower back, initial encounter: Secondary | ICD-10-CM | POA: Diagnosis present

## 2022-05-14 MED ORDER — KETOROLAC TROMETHAMINE 15 MG/ML IJ SOLN
15.0000 mg | Freq: Once | INTRAMUSCULAR | Status: AC
  Administered 2022-05-14: 15 mg via INTRAMUSCULAR
  Filled 2022-05-14: qty 1

## 2022-05-14 MED ORDER — PREDNISONE 10 MG PO TABS: ORAL_TABLET | ORAL | 0 refills | Status: AC

## 2022-05-14 MED ORDER — CYCLOBENZAPRINE HCL 10 MG PO TABS: 10.0000 mg | ORAL_TABLET | Freq: Two times a day (BID) | ORAL | 0 refills | Status: DC | PRN

## 2022-05-14 MED ORDER — PREDNISONE 10 MG PO TABS: ORAL_TABLET | ORAL | 0 refills | Status: DC

## 2022-05-14 NOTE — ED Triage Notes (Signed)
Right lower back spasms.   Sts this happened recently and got a shot and some tramadol.

## 2022-05-14 NOTE — ED Provider Notes (Signed)
[facility]-EMERGENCY DEPT Provider Note   CSN: [id number] Arrival date & time: 05/14/22  4098     History  Chief Complaint  Patient presents with   Back Pain    Reginald Cos Sr. is a 66 y.o. male.   Back Pain Patient presents with back pain.  Has had history of same.  Tightness.  Has been seen both in the ER and by Dr. Darrelyn Hillock in the past.  No numbness or weakness but does radiate down the right leg.  No loss of bladder bowel control.  No fevers.     Home Medications Prior to Admission medications   Medication Sig Start Date End Date Taking? Authorizing Provider  cyclobenzaprine (FLEXERIL) 10 MG tablet Take 1 tablet (10 mg total) by mouth 2 (two) times daily as needed for muscle spasms. 05/14/22  Yes Benjiman Core, MD  predniSONE (DELTASONE) 10 MG tablet Take 3 tablets (30 mg total) by mouth daily for 2 days, THEN 2 tablets (20 mg total) daily for 5 days, THEN 1 tablet (10 mg total) daily for 5 days. 05/14/22 05/26/22 Yes Benjiman Core, MD  acetaminophen (TYLENOL) 500 MG tablet Take 1 tablet by mouth 3 (three) times daily as needed. 01/19/20   [provider]  aspirin EC 81 MG tablet Take 81 mg by mouth daily.    [provider]  atorvastatin (LIPITOR) 20 MG tablet Take 20 mg by mouth daily.    [provider]  diclofenac (VOLTAREN) 75 MG EC tablet Take 1 tablet (75 mg total) by mouth 2 (two) times daily. 09/04/19   Eustace Moore, MD  fluticasone [facility]) 50 MCG/ACT nasal spray INSTILL 1 SPRAY IN EACH NOSTRIL AT BEDTIME 01/19/22   [provider]  gabapentin (NEURONTIN) 300 MG capsule Take 1 capsule by mouth at bedtime. 04/15/21   [provider]  HYDROcodone-acetaminophen (NORCO/VICODIN) 5-325 MG tablet Take 1 tablet by mouth every 6 (six) hours as needed for severe pain. 02/10/22   Renne Crigler, PA-C  lidocaine (LIDODERM) 5 % APPLY 1 PATCH TO SKIN ONCE DAILY (APPLY FOR 12 HOURS, THEN REMOVE FOR 12 HOURS)  02/10/22   Renne Crigler, PA-C  lisinopril-hydrochlorothiazide (ZESTORETIC) 20-12.5 MG tablet Take 1 tablet by mouth 2 (two) times daily. 01/19/22   [provider]  loratadine (CLARITIN) 10 MG tablet Take 1 tablet by mouth daily. 07/22/21   [provider]  methocarbamol (ROBAXIN) 500 MG tablet Take 1 tablet (500 mg total) by mouth 2 (two) times daily. 02/10/22   Renne Crigler, PA-C  nortriptyline (PAMELOR) 75 MG capsule TAKE ONE CAPSULE BY MOUTH AT BEDTIME AS NEEDED (DOSE INCREASE) 12/30/21   [provider]  omeprazole (PRILOSEC) 20 MG capsule Take 20 mg by mouth daily.    [provider]  tamsulosin (FLOMAX) 0.4 MG CAPS capsule Take by mouth. 04/01/21   [provider]  traMADol (ULTRAM) 50 MG tablet Take 50 mg by mouth 3 (three) times daily as needed. 08/13/21   [provider]  Oxcarbazepine (TRILEPTAL) 300 MG tablet Take 1 tablet (300 mg total) by mouth 2 (two) times daily. 04/12/17 09/04/19  Levert Feinstein, MD  pregabalin (LYRICA) 300 MG capsule Take 1 capsule (300 mg total) by mouth 3 (three) times daily. 04/12/17 08/01/19  Levert Feinstein, MD      Allergies    Patient has no known allergies.    Review of Systems   Review of Systems  Musculoskeletal:  Positive for back pain.    Physical  Exam Updated Vital Signs BP (!) 129/90 (BP Location: Left Arm)   Pulse 90   Temp 98.5 F (36.9 C) (Oral)   Resp 16   Ht 5\' 11"  (1.803 m)   Wt 88.9 kg   SpO2 98%   BMI 27.34 kg/m  Physical Exam Vitals and nursing note reviewed.  Abdominal:     Tenderness: There is no abdominal tenderness.  Musculoskeletal:        General: Tenderness present.     Comments: Right lumbar paraspinal tenderness.  Some tightness also.  Some pain with straight leg raise.  Able to ambulate but does have some pain with it.  Neurological:     Mental Status: He is alert.     ED Results / Procedures / Treatments   Labs (all labs ordered are listed, but only abnormal  results are displayed) Labs Reviewed - No data to display  EKG None  Radiology No results found.  Procedures Procedures    Medications Ordered in ED Medications  ketorolac (TORADOL) 15 MG/ML injection 15 mg (has no administration in time range)    ED Course/ Medical Decision Making/ A&P                           Medical Decision Making Risk Prescription drug management.   Patient with low back pain.  Somewhat acute on chronic.  Reviewed orthopedic notes.  Has had visits a couple months ago for the same.  Has been given steroids Toradol and prednisone pack.  Also had Flexeril which she tolerated well.  Will basically repeat the same cocktail.  Toradol given here.  Refilled prednisone in the same dosing he had previously.  We will also give the Flexeril that he tolerated.  Does not appear to need further investigation at this time.  I think this is similar episode as before.  Will follow with Dr. .        Final Clinical Impression(s) / ED Diagnoses Final diagnoses:  Strain of lumbar region, initial encounter    Rx / DC Orders ED Discharge Orders          Ordered    cyclobenzaprine (FLEXERIL) 10 MG tablet  2 times daily PRN        05/14/22 0903    predniSONE (DELTASONE) 10 MG tablet  Daily        05/14/22 0903              05/16/22, MD 05/14/22 [phone]

## 2022-05-21 DIAGNOSIS — M545 Low back pain, unspecified: Secondary | ICD-10-CM | POA: Diagnosis not present

## 2022-05-23 ENCOUNTER — Encounter (HOSPITAL_COMMUNITY): Payer: Self-pay

## 2022-05-23 ENCOUNTER — Ambulatory Visit (HOSPITAL_COMMUNITY)
Admission: EM | Admit: 2022-05-23 | Discharge: 2022-05-23 | Disposition: A | Discharge status: Home / Self Care | Payer: Medicare HMO

## 2022-05-23 DIAGNOSIS — Z720 Tobacco use: Secondary | ICD-10-CM | POA: Diagnosis not present

## 2022-05-23 DIAGNOSIS — I10 Essential (primary) hypertension: Secondary | ICD-10-CM | POA: Diagnosis not present

## 2022-05-23 DIAGNOSIS — U071 COVID-19: Secondary | ICD-10-CM

## 2022-05-23 NOTE — Discharge Instructions (Addendum)
We will call you once we have your blood test result to monitor your kidney function and can start the Paxlovid.  While you are taking Paxlovid do not take your atorvastatin and wait 3 days after finishing Paxlovid before restarting this.  You should not take more than 1 tamsulosin per day.  You should not take hydrocodone or tramadol with Paxlovid.  Make sure you rest and drink plenty of fluids.  If you have any worsening symptoms including shortness of breath, chest pain, weakness, nausea, vomiting you need to be seen immediately.  I do recommend that you get a pulse oximeter (1 of those devices you put on your finger to monitor your oxygen) from your pharmacy.  Monitor your oxygen regularly and if this drops below 93% you should be seen if it drops below 90% you need to go to the emergency room.

## 2022-05-23 NOTE — ED Triage Notes (Signed)
Pt tested positive yesterday for covid  and would like to get the treatment for it

## 2022-05-23 NOTE — ED Provider Notes (Signed)
[facility]    CSN: [id number] Arrival date & time: 05/23/22  1006      History   Chief Complaint Chief Complaint  Patient presents with   Covid Positive    HPI Reginald FIOLA Sr. is a 66 y.o. male.   Patient resents today with a 4-day history of URI symptoms.  Reports cough, congestion, subjective fever, fatigue, malaise.  Denies any chest pain, shortness of breath, nausea, vomiting.  He took an at-home COVID test yesterday and was positive.  He has not had COVID in the past.  He has had COVID vaccines.  He is a current everyday smoker.  He also has a history of hypertension.  He has not been taking any over-the-counter medication for symptom management.  Denies any recent antibiotic or steroid use.  Does report household sick contacts with similar symptoms.  He is interested in initiating antiviral therapy today.    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 No pertinent surgical history.     Home Medications    Prior to Admission medications   Medication Sig Start Date End Date Taking? Authorizing Provider  aspirin EC 81 MG tablet TAKE TWO TABLETS BY MOUTH DAILY FOR HEART 05/05/22  Yes [provider]  atorvastatin (LIPITOR) 10 MG tablet TAKE ONE TABLET BY MOUTH AT BEDTIME FOR CHOLESTEROL 05/05/22  Yes [provider]  nortriptyline (PAMELOR) 75 MG capsule TAKE ONE CAPSULE BY MOUTH AT BEDTIME AS NEEDED (DOSE INCREASE) 12/30/21  Yes [provider]  omeprazole (PRILOSEC) 40 MG capsule TAKE ONE CAPSULE BY MOUTH DAILY (TAKE ON AN EMPTY STOMACH 30 MINUTES PRIOR TO A MEAL) 05/05/22  Yes [provider]  acetaminophen (TYLENOL) 500 MG tablet Take 1 tablet by mouth 3 (three) times daily as needed. 01/19/20   [provider]  aspirin EC 81 MG tablet Take 81 mg by mouth daily.    [provider]  atorvastatin (LIPITOR) 20 MG tablet Take 20 mg by mouth daily.    [provider]  cyclobenzaprine (FLEXERIL) 10 MG tablet Take 1 tablet (10 mg total) by mouth 2 (two) times daily as needed for muscle spasms. 05/14/22   Benjiman Core, MD  diclofenac (VOLTAREN) 75 MG EC tablet Take 1 tablet (75 mg total) by mouth 2 (two) times daily. 09/04/19   Eustace Moore, MD  fluticasone [facility]) 50 MCG/ACT nasal spray INSTILL  1 SPRAY IN EACH NOSTRIL AT BEDTIME 01/19/22   [provider]  gabapentin (NEURONTIN) 300 MG capsule Take 1 capsule by mouth at bedtime. 04/15/21   [provider]  HYDROcodone-acetaminophen (NORCO/VICODIN) 5-325 MG tablet Take 1 tablet by mouth every 6 (six) hours as needed for severe pain. 02/10/22   Renne Crigler, PA-C  lidocaine (LIDODERM) 5 % APPLY 1 PATCH TO SKIN ONCE DAILY (APPLY FOR 12 HOURS, THEN REMOVE FOR 12 HOURS) 02/10/22   Renne Crigler,  PA-C  lisinopril-hydrochlorothiazide (ZESTORETIC) 20-12.5 MG tablet Take 1 tablet by mouth 2 (two) times daily. 01/19/22   [provider]  loratadine (CLARITIN) 10 MG tablet Take 1 tablet by mouth daily. 07/22/21   [provider]  methocarbamol (ROBAXIN) 500 MG tablet Take 1 tablet (500 mg total) by mouth 2 (two) times daily. 02/10/22   Renne Crigler, PA-C  nortriptyline (PAMELOR) 75 MG capsule TAKE ONE CAPSULE BY MOUTH AT BEDTIME AS NEEDED (DOSE INCREASE) 12/30/21   [provider]  omeprazole (PRILOSEC) 20 MG capsule Take 20 mg by mouth daily.    [provider]  predniSONE (DELTASONE) 10 MG tablet Take 3 tablets (30 mg total) by mouth daily for 2 days, THEN 2 tablets (20 mg total) daily for 5 days, THEN 1 tablet (10 mg total) daily for 5 days. 05/14/22 05/26/22  Benjiman Core, MD  tamsulosin (FLOMAX) 0.4 MG CAPS capsule Take by mouth. 04/01/21   [provider]  traMADol (ULTRAM) 50 MG tablet Take 50 mg by mouth 3 (three) times daily as needed. 08/13/21   [provider]  Oxcarbazepine (TRILEPTAL) 300 MG tablet Take 1 tablet (300 mg total) by mouth 2 (two) times daily. 04/12/17 09/04/19  Levert Feinstein, MD  pregabalin (LYRICA) 300 MG capsule Take 1 capsule (300 mg total) by mouth 3 (three) times daily. 04/12/17 08/01/19  Levert Feinstein, MD    Family History Family History  Problem Relation Age of Onset   Diabetes Mother    Hypertension Mother    Kidney failure Mother    Hypertension Father    Prostate cancer Father    Diabetes Sister    Cancer Paternal Grandfather        Lung    Social History Social History   Tobacco Use   Smoking status: Every Day    Packs/day: 1.00    Types: Cigarettes   Smokeless tobacco: Never  Vaping Use   Vaping Use: Never used  Substance Use Topics   Alcohol use: No    Comment: Quit in 2018   Drug use: Not Currently    Types: "Crack" cocaine, Marijuana     Allergies   Patient has no known  allergies.   Review of Systems Review of Systems  Constitutional:  Positive for activity change, fatigue and fever. Negative for appetite change.  HENT:  Positive for congestion. Negative for sinus pressure, sneezing and sore throat.   Respiratory:  Positive for cough. Negative for shortness of breath.   Cardiovascular:  Negative for chest pain.  Gastrointestinal:  Negative for abdominal pain, diarrhea, nausea and vomiting.  Neurological:  Negative for dizziness, light-headedness and headaches.     Physical Exam Triage Vital Signs ED Triage Vitals  Enc Vitals Group     BP 05/23/22 1034 (!) 146/105     Pulse Rate 05/23/22 1034 94     Resp 05/23/22 1034 12     Temp 05/23/22 1034 98 F (36.7 C)     Temp Source 05/23/22 1034 Oral  SpO2 05/23/22 1034 96 %     Weight 05/23/22 1035 190 lb (86.2 kg)     Height 05/23/22 1035 5\' 11"  (1.803 m)     Head Circumference --      Peak Flow --      Pain Score --      Pain Loc --      Pain Edu? --      Excl. in GC? --    No data found.  Updated Vital Signs BP (!) 146/105 (BP Location: Right Arm)   Pulse 94   Temp 98 F (36.7 C) (Oral)   Resp 12   Ht 5\' 11"  (1.803 m)   Wt 190 lb (86.2 kg)   SpO2 96%   BMI 26.50 kg/m   Visual Acuity Right Eye Distance:   Left Eye Distance:   Bilateral Distance:    Right Eye Near:   Left Eye Near:    Bilateral Near:     Physical Exam Vitals reviewed.  Constitutional:      General: He is awake.     Appearance: Normal appearance. He is well-developed. He is not ill-appearing.     Comments: Very pleasant male appears stated age in no acute distress sitting comfortably in exam room  HENT:     Head: Normocephalic and atraumatic.     Right Ear: Tympanic membrane, ear canal and external ear normal. Tympanic membrane is not erythematous or bulging.     Left Ear: Tympanic membrane, ear canal and external ear normal. Tympanic membrane is not erythematous or bulging.     Nose: Nose normal.      Mouth/Throat:     Pharynx: Uvula midline. No oropharyngeal exudate, posterior oropharyngeal erythema or uvula swelling.  Cardiovascular:     Rate and Rhythm: Normal rate and regular rhythm.     Heart sounds: Normal heart sounds, S1 normal and S2 normal. No murmur heard. Pulmonary:     Effort: Pulmonary effort is normal. No accessory muscle usage or respiratory distress.     Breath sounds: Normal breath sounds. No stridor. No wheezing, rhonchi or rales.     Comments: Clear to auscultation bilaterally Neurological:     Mental Status: He is alert.  Psychiatric:        Behavior: Behavior is cooperative.      UC Treatments / Results  Labs (all labs ordered are listed, but only abnormal results are displayed) Labs Reviewed  BASIC METABOLIC PANEL    EKG   Radiology No results found.  Procedures Procedures (including critical care time)  Medications Ordered in UC Medications - No data to display  Initial Impression / Assessment and Plan / UC Course  I have reviewed the triage vital signs and the nursing notes.  Pertinent labs & imaging results that were available during my care of the patient were reviewed by me and considered in my medical decision making (see chart for details).     Patient is well-appearing, afebrile, nontoxic, nontachycardic.  He had an at-home COVID test that was positive and is within 5 days of symptom onset so qualifies for antivirals based on age, history of hypertension, current everyday smoking.  He is interested in starting Paxlovid.  He does not take tramadol/hydrocodone regularly and discussed that he should not take this with Paxlovid.  He is to hold his atorvastatin while on this medication for 3 days after completing course.  He should not take more than 1 dose of tamsulosin daily.  His metabolic panel was  obtained as his last BMP was from January.  We will send in Paxlovid soon as we have this result and are able to appropriately dose the  medication based on renal function.  Recommended that he rest and drink plenty of fluid.  He can use over-the-counter medications for symptom relief.  Recommended that he obtain a pulse oximeter and monitor his oxygen saturation to help determine if he needs to seek additional care.  Strict return precautions given.  Recommend close follow-up with primary care.  Final Clinical Impressions(s) / UC Diagnoses   Final diagnoses:  COVID-19  Tobacco abuse  Essential hypertension     Discharge Instructions      We will call you once we have your blood test result to monitor your kidney function and can start the Paxlovid.  While you are taking Paxlovid do not take your atorvastatin and wait 3 days after finishing Paxlovid before restarting this.  You should not take more than 1 tamsulosin per day.  You should not take hydrocodone or tramadol with Paxlovid.  Make sure you rest and drink plenty of fluids.  If you have any worsening symptoms including shortness of breath, chest pain, weakness, nausea, vomiting you need to be seen immediately.  I do recommend that you get a pulse oximeter (1 of those devices you put on your finger to monitor your oxygen) from your pharmacy.  Monitor your oxygen regularly and if this drops below 93% you should be seen if it drops below 90% you need to go to the emergency room.      ED Prescriptions   None    PDMP not reviewed this encounter.   Jeani Hawking, PA-C 05/23/22 1140

## 2022-06-04 ENCOUNTER — Emergency Department (HOSPITAL_COMMUNITY): Payer: No Typology Code available for payment source

## 2022-06-04 ENCOUNTER — Other Ambulatory Visit: Payer: Self-pay

## 2022-06-04 ENCOUNTER — Emergency Department (HOSPITAL_COMMUNITY)
Admission: EM | Admit: 2022-06-04 | Discharge: 2022-06-04 | Disposition: A | Discharge status: Home / Self Care | Payer: No Typology Code available for payment source | Attending: Emergency Medicine | Admitting: Emergency Medicine

## 2022-06-04 ENCOUNTER — Encounter (HOSPITAL_COMMUNITY): Payer: Self-pay

## 2022-06-04 DIAGNOSIS — Z7982 Long term (current) use of aspirin: Secondary | ICD-10-CM | POA: Insufficient documentation

## 2022-06-04 DIAGNOSIS — R519 Headache, unspecified: Secondary | ICD-10-CM | POA: Diagnosis not present

## 2022-06-04 DIAGNOSIS — I10 Essential (primary) hypertension: Secondary | ICD-10-CM | POA: Insufficient documentation

## 2022-06-04 DIAGNOSIS — Z79899 Other long term (current) drug therapy: Secondary | ICD-10-CM | POA: Insufficient documentation

## 2022-06-04 DIAGNOSIS — H8110 Benign paroxysmal vertigo, unspecified ear: Secondary | ICD-10-CM | POA: Diagnosis not present

## 2022-06-04 DIAGNOSIS — R42 Dizziness and giddiness: Secondary | ICD-10-CM | POA: Diagnosis not present

## 2022-06-04 LAB — CBC WITH DIFFERENTIAL/PLATELET
Abs Immature Granulocytes: 0.06 10*3/uL (ref 0.00–0.07)
Basophils Absolute: 0 10*3/uL (ref 0.0–0.1)
Basophils Relative: 0 %
Eosinophils Absolute: 0.1 10*3/uL (ref 0.0–0.5)
Eosinophils Relative: 1 %
HCT: 44.4 % (ref 39.0–52.0)
Hemoglobin: 14.5 g/dL (ref 13.0–17.0)
Immature Granulocytes: 1 %
Lymphocytes Relative: 37 %
Lymphs Abs: 2.7 10*3/uL (ref 0.7–4.0)
MCH: 28.4 pg (ref 26.0–34.0)
MCHC: 32.7 g/dL (ref 30.0–36.0)
MCV: 86.9 fL (ref 80.0–100.0)
Monocytes Absolute: 0.6 10*3/uL (ref 0.1–1.0)
Monocytes Relative: 8 %
Neutro Abs: 3.9 10*3/uL (ref 1.7–7.7)
Neutrophils Relative %: 53 %
Platelets: 295 10*3/uL (ref 150–400)
RBC: 5.11 MIL/uL (ref 4.22–5.81)
RDW: 13.2 % (ref 11.5–15.5)
WBC: 7.4 10*3/uL (ref 4.0–10.5)
nRBC: 0 % (ref 0.0–0.2)

## 2022-06-04 LAB — COMPREHENSIVE METABOLIC PANEL
ALT: 25 U/L (ref 0–44)
AST: 20 U/L (ref 15–41)
Albumin: 4.1 g/dL (ref 3.5–5.0)
Alkaline Phosphatase: 59 U/L (ref 38–126)
Anion gap: 10 (ref 5–15)
BUN: 14 mg/dL (ref 8–23)
CO2: 27 mmol/L (ref 22–32)
Calcium: 10.1 mg/dL (ref 8.9–10.3)
Chloride: 105 mmol/L (ref 98–111)
Creatinine, Ser: 1.1 mg/dL (ref 0.61–1.24)
GFR, Estimated: >60 mL/min (ref >60)
Glucose, Bld: 123 mg/dL — ABNORMAL HIGH (ref 70–99)
Potassium: 4.1 mmol/L (ref 3.5–5.1)
Sodium: 142 mmol/L (ref 135–145)
Total Bilirubin: 1 mg/dL (ref 0.3–1.2)
Total Protein: 8.1 g/dL (ref 6.5–8.1)

## 2022-06-04 MED ORDER — METHYLPREDNISOLONE SODIUM SUCC 125 MG IJ SOLR
125.0000 mg | Freq: Once | INTRAMUSCULAR | Status: AC
  Administered 2022-06-04: 125 mg via INTRAVENOUS
  Filled 2022-06-04: qty 2

## 2022-06-04 MED ORDER — MECLIZINE HCL 25 MG PO TABS: 25.0000 mg | ORAL_TABLET | Freq: Three times a day (TID) | ORAL | 0 refills | Status: AC | PRN

## 2022-06-04 MED ORDER — LORAZEPAM 2 MG/ML IJ SOLN
0.5000 mg | Freq: Once | INTRAMUSCULAR | Status: AC
  Administered 2022-06-04: 0.5 mg via INTRAVENOUS
  Filled 2022-06-04: qty 1

## 2022-06-04 MED ORDER — GADOBUTROL 1 MMOL/ML IV SOLN
8.5000 mL | Freq: Once | INTRAVENOUS | Status: AC | PRN
  Administered 2022-06-04: 8.5 mL via INTRAVENOUS

## 2022-06-04 MED ORDER — MECLIZINE HCL 25 MG PO TABS
25.0000 mg | ORAL_TABLET | Freq: Once | ORAL | Status: AC
  Administered 2022-06-04: 25 mg via ORAL
  Filled 2022-06-04: qty 1

## 2022-06-04 MED ORDER — MECLIZINE HCL 25 MG PO TABS: 25.0000 mg | ORAL_TABLET | Freq: Three times a day (TID) | ORAL | 0 refills | Status: DC | PRN

## 2022-06-04 MED ORDER — ONDANSETRON 4 MG PO TBDP: ORAL_TABLET | ORAL | 0 refills | Status: DC

## 2022-06-04 MED ORDER — SODIUM CHLORIDE 0.9 % IV BOLUS
1000.0000 mL | Freq: Once | INTRAVENOUS | Status: AC
  Administered 2022-06-04: 1000 mL via INTRAVENOUS

## 2022-06-04 MED ORDER — ONDANSETRON HCL 4 MG/2ML IJ SOLN
4.0000 mg | Freq: Once | INTRAMUSCULAR | Status: AC
  Administered 2022-06-04: 4 mg via INTRAVENOUS
  Filled 2022-06-04: qty 2

## 2022-06-04 NOTE — ED Notes (Signed)
Patient transported to MRI 

## 2022-06-04 NOTE — Discharge Instructions (Signed)
Drink plenty of fluids.  Take medicines as prescribed.  Follow-up with Dr. Redmond Baseman if not improving by next week

## 2022-06-04 NOTE — ED Triage Notes (Addendum)
Patient c/o SOB, HTN, and emesis x 2 days.  BP retaken in triage- 141/108

## 2022-06-04 NOTE — ED Provider Notes (Signed)
[facility] DEPT Provider Note   CSN: [place]:[id number] Arrival date & time: 06/04/22  [id number]     History  Chief Complaint  Patient presents with   Shortness of Breath   Emesis   Hypertension    Reginald PRICHARD Sr. is a 66 y.o. male.  Patient complains of dizziness.  Patient has history of hypertension.  Patient states the room is spinning around.  This started earlier today  The history is provided by the patient and medical records. No language interpreter was used.  Dizziness Quality:  Head spinning and imbalance Severity:  Moderate Onset quality:  Sudden Timing:  Constant Progression:  Worsening Chronicity:  New Context: bending over   Relieved by:  Nothing Worsened by:  Nothing Associated symptoms: no chest pain, no diarrhea and no headaches        Home Medications Prior to Admission medications   Medication Sig Start Date End Date Taking? Authorizing Provider  acetaminophen (TYLENOL) 500 MG tablet Take 1 tablet by mouth 3 (three) times daily as needed. 01/19/20   [provider]  aspirin EC 81 MG tablet Take 81 mg by mouth daily.    [provider]  aspirin EC 81 MG tablet TAKE TWO TABLETS BY MOUTH DAILY FOR HEART 05/05/22   [provider]  atorvastatin (LIPITOR) 10 MG tablet TAKE ONE TABLET BY MOUTH AT BEDTIME FOR CHOLESTEROL 05/05/22   [provider]  atorvastatin (LIPITOR) 20 MG tablet Take 20 mg by mouth daily.    [provider]  cyclobenzaprine (FLEXERIL) 10 MG tablet Take 1 tablet (10 mg total) by mouth 2 (two) times daily as needed for muscle spasms. 05/14/22   Davonna Belling, MD  diclofenac (VOLTAREN) 75 MG EC tablet Take 1 tablet (75 mg total) by mouth 2 (two) times daily. 09/04/19   Raylene Everts, MD  fluticasone [facility]) 50 MCG/ACT nasal spray INSTILL 1 SPRAY IN EACH NOSTRIL AT BEDTIME 01/19/22   [provider]  gabapentin (NEURONTIN) 300 MG capsule Take 1 capsule by  mouth at bedtime. 04/15/21   [provider]  HYDROcodone-acetaminophen (NORCO/VICODIN) 5-325 MG tablet Take 1 tablet by mouth every 6 (six) hours as needed for severe pain. 02/10/22   Carlisle Cater, PA-C  lidocaine (LIDODERM) 5 % APPLY 1 PATCH TO SKIN ONCE DAILY (APPLY FOR 12 HOURS, THEN REMOVE FOR 12 HOURS) 02/10/22   Carlisle Cater, PA-C  lisinopril-hydrochlorothiazide (ZESTORETIC) 20-12.5 MG tablet Take 1 tablet by mouth 2 (two) times daily. 01/19/22   [provider]  loratadine (CLARITIN) 10 MG tablet Take 1 tablet by mouth daily. 07/22/21   [provider]  meclizine (ANTIVERT) 25 MG tablet Take 1 tablet (25 mg total) by mouth 3 (three) times daily as needed for dizziness. 06/04/22   Milton Ferguson, MD  methocarbamol (ROBAXIN) 500 MG tablet Take 1 tablet (500 mg total) by mouth 2 (two) times daily. 02/10/22   Carlisle Cater, PA-C  nortriptyline (PAMELOR) 75 MG capsule TAKE ONE CAPSULE BY MOUTH AT BEDTIME AS NEEDED (DOSE INCREASE) 12/30/21   [provider]  nortriptyline (PAMELOR) 75 MG capsule TAKE ONE CAPSULE BY MOUTH AT BEDTIME AS NEEDED (DOSE INCREASE) 12/30/21   [provider]  omeprazole (PRILOSEC) 20 MG capsule Take 20 mg by mouth daily.    [provider]  omeprazole (PRILOSEC) 40 MG capsule TAKE ONE CAPSULE BY MOUTH DAILY (TAKE ON AN EMPTY STOMACH 30 MINUTES PRIOR TO A MEAL) 05/05/22   [provider]  ondansetron (ZOFRAN-ODT)  4 MG disintegrating tablet 4mg  ODT q4 hours prn nausea/vomit 06/04/22   Milton Ferguson, MD  tamsulosin [facility]) 0.4 MG CAPS capsule Take by mouth. 04/01/21   [provider]  traMADol (ULTRAM) 50 MG tablet Take 50 mg by mouth 3 (three) times daily as needed. 08/13/21   [provider]  Oxcarbazepine (TRILEPTAL) 300 MG tablet Take 1 tablet (300 mg total) by mouth 2 (two) times daily. 04/12/17 09/04/19  Marcial Pacas, MD  pregabalin (LYRICA) 300 MG capsule Take 1 capsule (300 mg total) by mouth 3  (three) times daily. 04/12/17 08/01/19  Marcial Pacas, MD      Allergies    Patient has no known allergies.    Review of Systems   Review of Systems  Constitutional:  Negative for appetite change and fatigue.  HENT:  Negative for congestion, ear discharge and sinus pressure.   Eyes:  Negative for discharge.  Respiratory:  Negative for cough.   Cardiovascular:  Negative for chest pain.  Gastrointestinal:  Negative for abdominal pain and diarrhea.  Genitourinary:  Negative for frequency and hematuria.  Musculoskeletal:  Negative for back pain.  Skin:  Negative for rash.  Neurological:  Positive for dizziness. Negative for seizures and headaches.  Psychiatric/Behavioral:  Negative for hallucinations.     Physical Exam Updated Vital Signs BP (!) 151/98 (BP Location: Right Arm)   Pulse 92   Temp 98.1 F (36.7 C)   Resp 20   Ht 5\' 11"  (1.803 m)   Wt 86.2 kg   SpO2 95%   BMI 26.50 kg/m  Physical Exam Vitals and nursing note reviewed.  Constitutional:      Appearance: He is well-developed.  HENT:     Head: Normocephalic.     Nose: Nose normal.  Eyes:     General: No scleral icterus.    Conjunctiva/sclera: Conjunctivae normal.     Comments: No nystagmus  Neck:     Thyroid: No thyromegaly.  Cardiovascular:     Rate and Rhythm: Normal rate and regular rhythm.     Heart sounds: No murmur heard.    No friction rub. No gallop.  Pulmonary:     Breath sounds: No stridor. No wheezing or rales.  Chest:     Chest wall: No tenderness.  Abdominal:     General: There is no distension.     Tenderness: There is no abdominal tenderness. There is no rebound.  Musculoskeletal:        General: Normal range of motion.     Cervical back: Neck supple.  Lymphadenopathy:     Cervical: No cervical adenopathy.  Skin:    Findings: No erythema or rash.  Neurological:     Mental Status: He is oriented to person, place, and time.     Motor: No abnormal muscle tone.     Coordination:  Coordination normal.  Psychiatric:        Behavior: Behavior normal.     ED Results / Procedures / Treatments   Labs (all labs ordered are listed, but only abnormal results are displayed) Labs Reviewed  COMPREHENSIVE METABOLIC PANEL - Abnormal; Notable for the following components:      Result Value   Glucose, Bld 123 (*)    All other components within normal limits  CBC WITH DIFFERENTIAL/PLATELET    EKG EKG Interpretation  Date/Time:  Thursday June 04 2022 09:36:43 EDT Ventricular Rate:  87 PR Interval:  104 QRS Duration: 111 QT Interval:  374 QTC Calculation: 450 R Axis:   -  70 Text Interpretation: Ectopic atrial rhythm Multiform ventricular premature complexes Short PR interval Incomplete RBBB and LAFB Abnormal R-wave progression, early transition Left ventricular hypertrophy Confirmed by Milton Ferguson [phone]) on 06/04/2022 4:02:20 PM  Radiology MR BRAIN W WO CONTRAST  Result Date: 06/04/2022 CLINICAL DATA:  Dizziness EXAM: MRI HEAD WITHOUT AND WITH CONTRAST TECHNIQUE: Multiplanar, multiecho pulse sequences of the brain and surrounding structures were obtained without and with intravenous contrast. CONTRAST:  8.68mL GADAVIST GADOBUTROL 1 MMOL/ML IV SOLN COMPARISON:  No prior MRI, correlation is made with CT head 06/04/2022 FINDINGS: Evaluation is somewhat limited by motion artifact. Brain: No restricted diffusion to suggest acute or subacute infarct. No acute hemorrhage, mass, mass effect, or midline shift. No hydrocephalus or extra-axial collection. Scattered T2 hyperintense signal in the periventricular white matter, likely the sequela of mild chronic small vessel ischemic disease. No hemosiderin deposition to suggest remote hemorrhage. Vascular: Normal arterial flow voids. Normal arterial and venous enhancement. Skull and upper cervical spine: Prior left occipital craniotomy. Otherwise normal marrow signal. Sinuses/Orbits: No acute finding. Other: The mastoids are well  aerated. IMPRESSION: No acute intracranial process. No evidence of acute or subacute infarct. No abnormal enhancement. Electronically Signed   By: Merilyn Baba M.D.   On: 06/04/2022 17:49   CT Head Wo Contrast  Result Date: 06/04/2022 CLINICAL DATA:  Dizziness, persistent/recurrent, cardiac or vascular cause suspected. Patient reports dizziness with headache. History of hypertension. EXAM: CT HEAD WITHOUT CONTRAST TECHNIQUE: Contiguous axial images were obtained from the base of the skull through the vertex without intravenous contrast. RADIATION DOSE REDUCTION: This exam was performed according to the departmental dose-optimization program which includes automated exposure control, adjustment of the mA and/or kV according to patient size and/or use of iterative reconstruction technique. COMPARISON:  None Available. FINDINGS: Brain: There is no evidence of acute intracranial hemorrhage, mass lesion, brain edema or extra-axial fluid collection. The ventricles and subarachnoid spaces are appropriately sized for age. There is no CT evidence of acute cortical infarction. There is prominent calcification extending medial to the petrous apex along the anterior aspect of the left tentorium, measuring 1.7 x 0.8 cm on image 10/2. This is located just superior to the left internal auditory canal which does not appear widened. This also abuts a tortuous basilar artery. Vascular: Mild intracranial atherosclerosis. Tortuous basilar artery. No hyperdense vessel identified. Skull: Negative for fracture or focal lesion. Previous left occipital craniotomy. Sinuses/Orbits: The visualized paranasal sinuses and mastoid air cells are clear. No orbital abnormalities are seen. Other: None. IMPRESSION: 1. No acute intracranial findings. 2. Prominent calcification extending medial to the left petrous apex which may relate to the patient's previous left occipital craniotomy or reflect a calcified meningioma. Given proximity to the [facility],  this could contribute to the patient's symptoms. Correlate with prior surgical history and consider MRI for further evaluation. Electronically Signed   By: Richardean Sale M.D.   On: 06/04/2022 13:10    Procedures Procedures    Medications Ordered in ED Medications  ondansetron (ZOFRAN) injection 4 mg (4 mg Intravenous Given 06/04/22 1650)  methylPREDNISolone sodium succinate (SOLU-MEDROL) 125 mg/2 mL injection 125 mg (125 mg Intravenous Given 06/04/22 1651)  meclizine (ANTIVERT) tablet 25 mg (25 mg Oral Given 06/04/22 1819)  LORazepam (ATIVAN) injection 0.5 mg (0.5 mg Intravenous Given 06/04/22 1650)  sodium chloride 0.9 % bolus 1,000 mL (0 mLs Intravenous Stopped 06/04/22 1919)  gadobutrol (GADAVIST) 1 MMOL/ML injection 8.5 mL (8.5 mLs Intravenous Contrast Given 06/04/22 1731)    ED Course/  Medical Decision Making/ A&P                           Medical Decision Making Amount and/or Complexity of Data Reviewed Radiology: ordered.  Risk Prescription drug management.  This patient presents to the ED for concern of dizziness, this involves an extensive number of treatment options, and is a complaint that carries with it a high risk of complications and morbidity.  The differential diagnosis includes stroke, vertigo   Co morbidities that complicate the patient evaluation  Hypertension   Additional history obtained:  Additional history obtained from friend External records from outside source obtained and reviewed including hospital record   Lab Tests:  I Ordered, and personally interpreted labs.  The pertinent results include: CBC and chemistries unremarkable   Imaging Studies ordered:  I ordered imaging studies including CT head and MRI of head I independently visualized and interpreted imaging which showed MRI unremarkable.  CT shows a stroke I agree with the radiologist interpretation   Cardiac Monitoring: / EKG:  The patient was maintained on a cardiac monitor.  I  personally viewed and interpreted the cardiac monitored which showed an underlying rhythm of: nsr   Consultations Obtained: No consultant Problem List / ED Course / Critical interventions / Medication management  Hypertension vertigo I ordered medication including Ativan, Antivert, normal saline Reevaluation of the patient after these medicines showed that the patient improved I have reviewed the patients home medicines and have made adjustments as needed   Social Determinants of Health:  None   Test / Admission - Considered:  No additional test needed  Patient with vertigo symptoms.  He is discharged home with Antivert and Zofran        Final Clinical Impression(s) / ED Diagnoses Final diagnoses:  Benign paroxysmal positional vertigo, unspecified laterality    Rx / DC Orders ED Discharge Orders          Ordered    meclizine (ANTIVERT) 25 MG tablet  3 times daily PRN,   Status:  Discontinued        06/04/22 1917    ondansetron (ZOFRAN-ODT) 4 MG disintegrating tablet  Status:  Discontinued        06/04/22 1917    meclizine (ANTIVERT) 25 MG tablet  3 times daily PRN        06/04/22 1930    ondansetron (ZOFRAN-ODT) 4 MG disintegrating tablet        06/04/22 1930              Milton Ferguson, MD 06/06/22 1201

## 2022-06-04 NOTE — ED Provider Triage Note (Signed)
Emergency Medicine Provider Triage Evaluation Note  Reginald Burns Sr. , a 66 y.o. male  was evaluated in triage.  Pt complains of shortness of breath, nausea, vomiting, dizziness.  Reports shortness of breath started yesterday.  Nausea vomiting started this morning.  States he has had high blood pressure for the past 2 days.  Missed some doses of his antihypertensive last week, but otherwise not missed any doses.  Dizziness has been present today.  Review of Systems  Positive: As above Negative: Fevers, chills, abdominal pain, diarrhea  Physical Exam  BP (!) 141/108   Pulse 89   Temp 97.6 F (36.4 C) (Oral)   Resp 20   Ht 5\' 11"  (1.803 m)   Wt 86.2 kg   SpO2 97%   BMI 26.50 kg/m  Gen:   Awake, no distress   Resp:  Normal effort  MSK:   Moves extremities without difficulty  Other:  Abdomen nontender  Medical Decision Making  Medically screening exam initiated at 11:02 AM.  Appropriate orders placed.  Reginald Burns Sr. was informed that the remainder of the evaluation will be completed by another provider, this initial triage assessment does not replace that evaluation, and the importance of remaining in the ED until their evaluation is complete.  Basic labs, CT head   Roylene Reason, Reginald Burke 06/04/22 1108

## 2022-06-22 IMAGING — CT CT ABD-PELV W/O CM
2 of 4 series · 15 of 46 positions shown, 17 images · non-contrast
Comparison: None.

CLINICAL DATA: 64-year-old male with hematuria and abdominal and
pelvic pain.

EXAM:
CT ABDOMEN AND PELVIS WITHOUT CONTRAST
TECHNIQUE: Multidetector CT imaging of the abdomen and pelvis was performed
following the standard protocol without IV contrast.

[Series 2: axial st · axial · 0.82mm/px · z∈[-526,-76]mm · 12 of 103 slices shown, 14 images]
[im 7/103  soft-tissue]
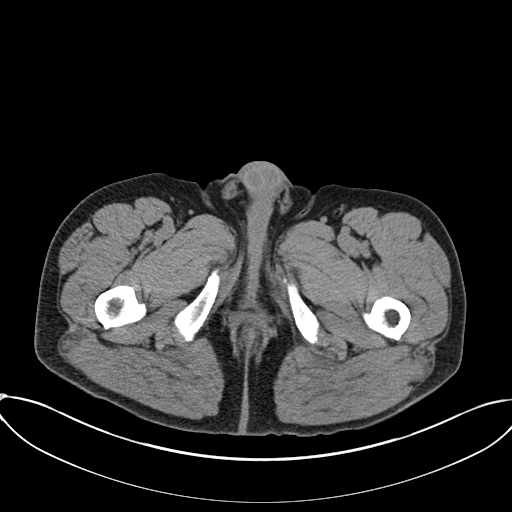
[im 7/103  bone]
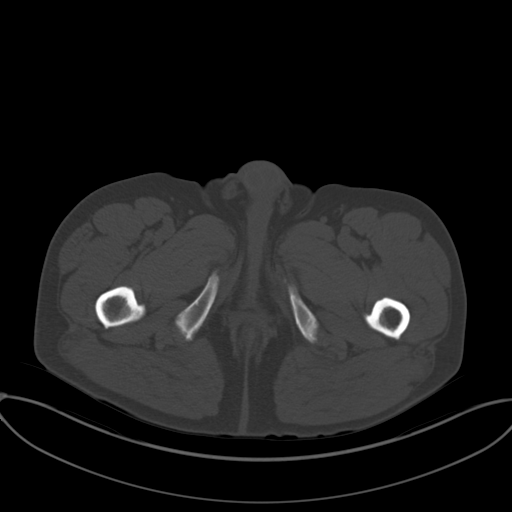
[im 19/103  soft-tissue]
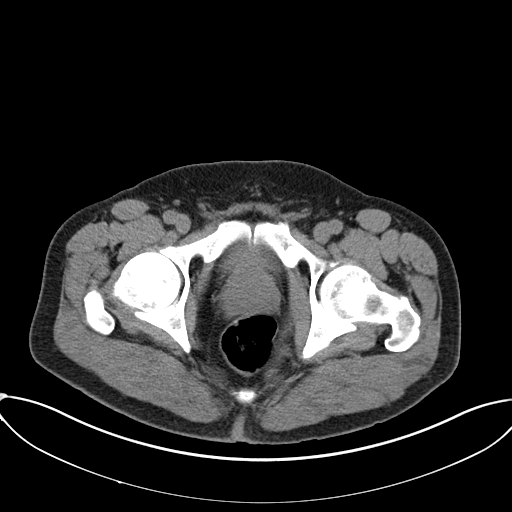
[im 25/103  soft-tissue]
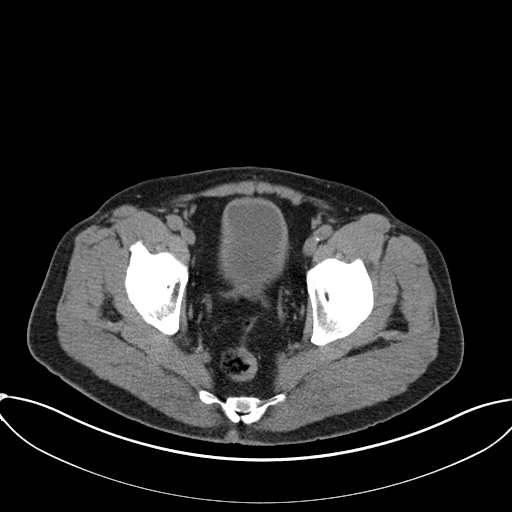
[im 31/103  soft-tissue]
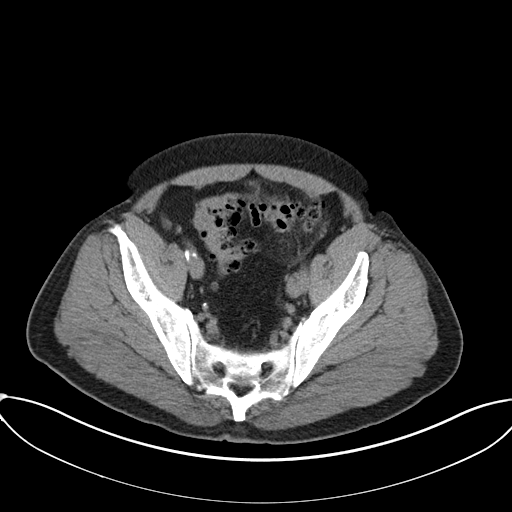
[im 43/103  soft-tissue]
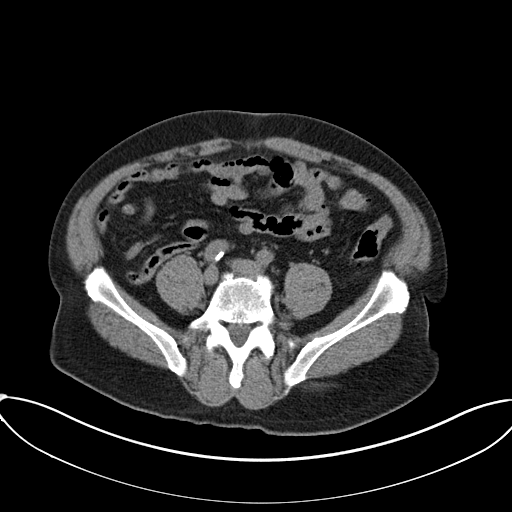
[im 49/103  soft-tissue]
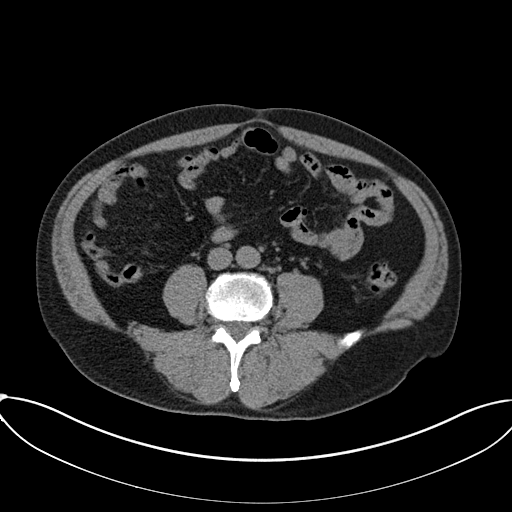
[im 55/103  soft-tissue]
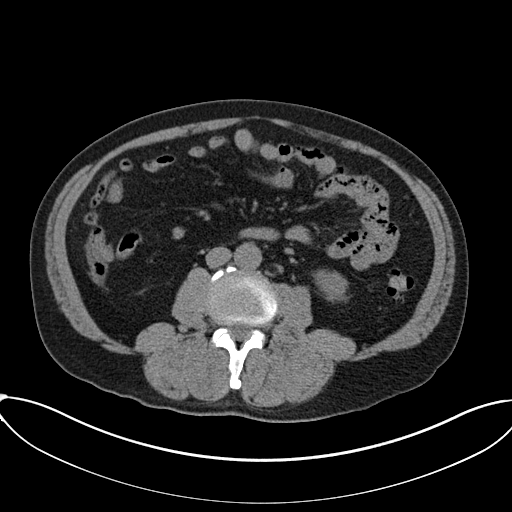
[im 67/103  soft-tissue]
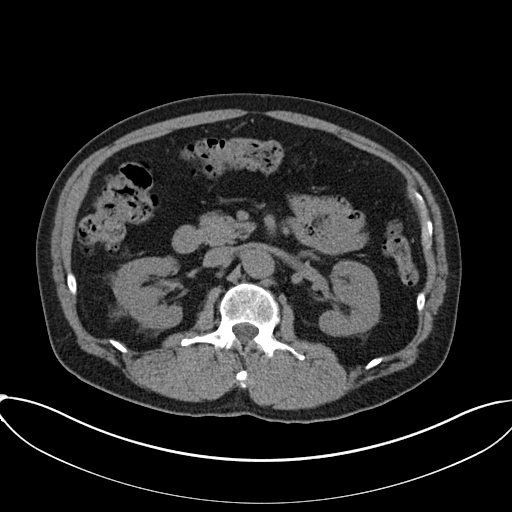
[im 73/103  soft-tissue]
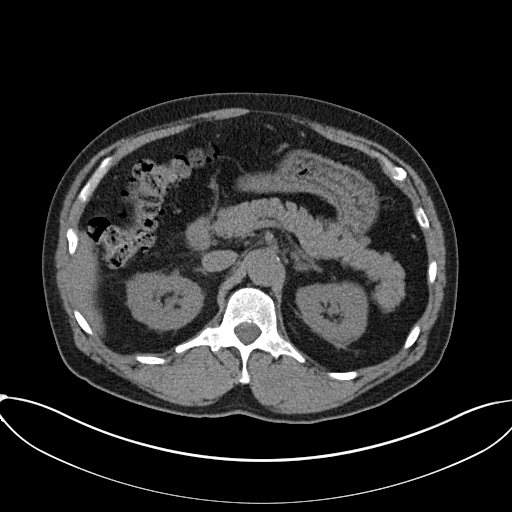
[im 73/103  bone]
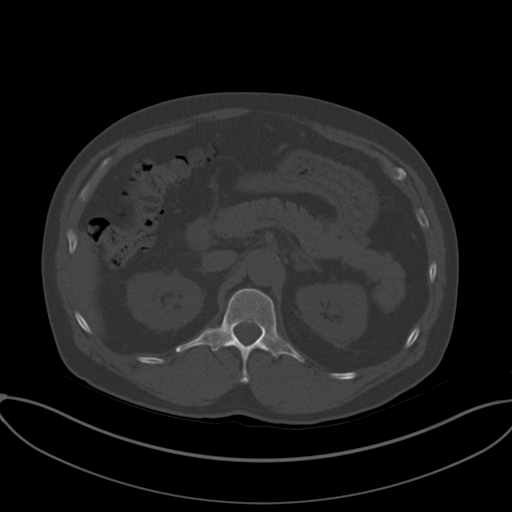
[im 79/103  soft-tissue]
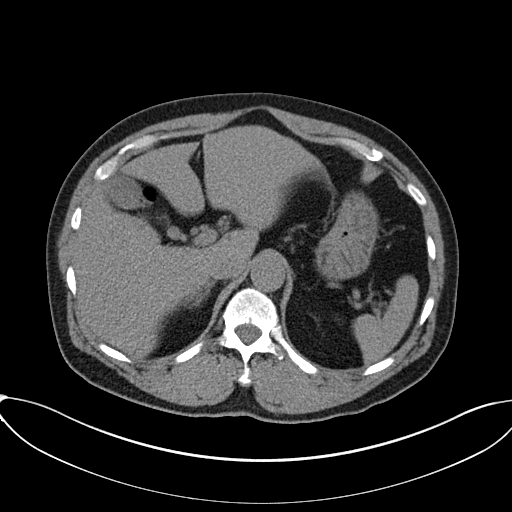
[im 91/103  soft-tissue]
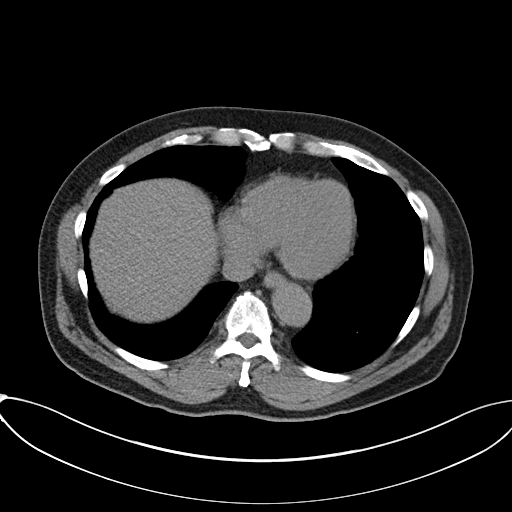
[im 97/103  soft-tissue]
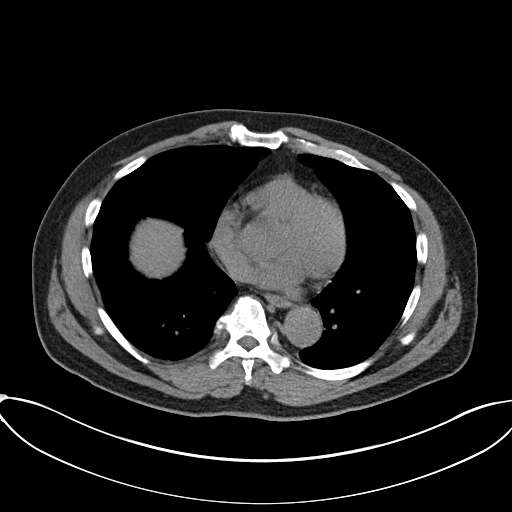

[Series 5: coronal st · coronal · 0.83mm/px · 3 of 162 slices shown]
[im 54/162  soft-tissue]
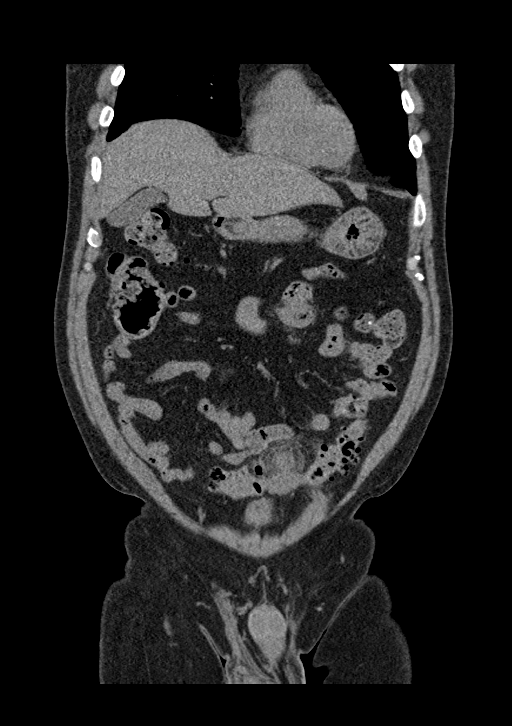
[im 72/162  soft-tissue]
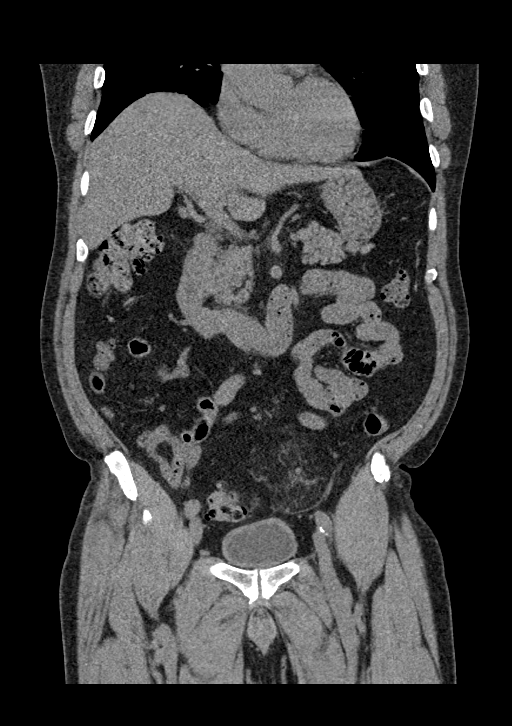
[im 90/162  soft-tissue]
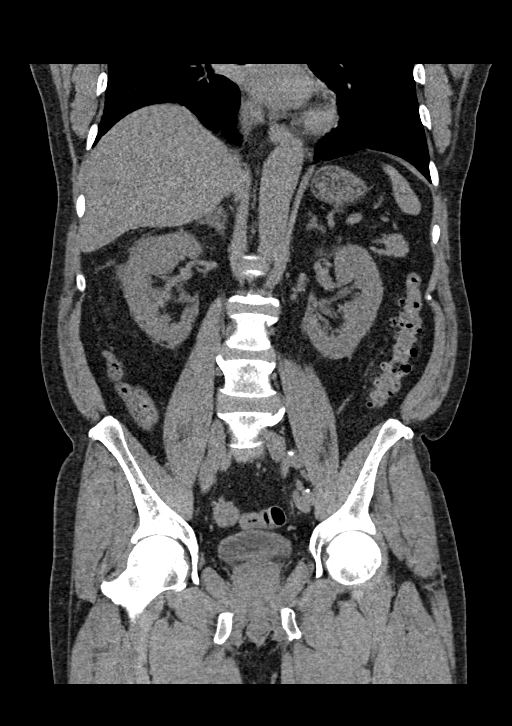

[15 of 46 positions shown; findings below may reference images not displayed]

FINDINGS: Please note that parenchymal abnormalities may be missed without
intravenous contrast.

Lower chest: No acute abnormality.

Hepatobiliary: Mild hepatic steatosis is noted. A 1.5 cm hypodense
lesion within the posterior RIGHT liver (series 2: Image 21) is
nonspecific but measures fluid density in some portions. No other
hepatic abnormalities are noted.

The gallbladder is unremarkable.  No biliary dilatation.

Pancreas: Unremarkable

Spleen: Unremarkable

Adrenals/Urinary Tract: The kidneys, adrenal glands and bladder are
unremarkable.

Stomach/Bowel: Mild wall thickening of the proximal sigmoid colon
with adjacent inflammation likely represents acute diverticulitis.
Extensive diverticulosis within the descending and sigmoid colon
noted. There is no evidence of focal collection or pneumoperitoneum.

No bowel obstruction is identified.  The appendix is normal.

Vascular/Lymphatic: Aortic atherosclerosis. No enlarged abdominal or
pelvic lymph nodes.

Reproductive: Mild prostate enlargement noted.

Other: No ascites. Small umbilical hernia containing fat is present.

Musculoskeletal: No acute or suspicious bony abnormalities are
identified.
IMPRESSION: 1. Proximal sigmoid colon wall thickening with adjacent inflammation
likely representing acute diverticulitis. Clinical follow-up
recommended. No evidence of focal collection or pneumoperitoneum.
2. Mild hepatic steatosis.
3. 1.5 cm nonspecific hypodense lesion within the posterior RIGHT
liver, more likely to be benign. If no prior outside studies are
available for comparison, consider elective MRI for further
evaluation, especially if this patient has a history of primary
malignancy.
4. Aortic Atherosclerosis ([id number]).

## 2022-07-31 ENCOUNTER — Ambulatory Visit (INDEPENDENT_AMBULATORY_CARE_PROVIDER_SITE_OTHER): Payer: Medicare HMO | Admitting: Family Medicine

## 2022-07-31 ENCOUNTER — Encounter: Payer: Self-pay | Admitting: Family Medicine

## 2022-07-31 VITALS — BP 136/92 | HR 87 | Temp 97.7°F | Ht 72.0 in | Wt 202.5 lb

## 2022-07-31 DIAGNOSIS — Z23 Encounter for immunization: Secondary | ICD-10-CM

## 2022-07-31 DIAGNOSIS — I10 Essential (primary) hypertension: Secondary | ICD-10-CM

## 2022-07-31 DIAGNOSIS — M5417 Radiculopathy, lumbosacral region: Secondary | ICD-10-CM

## 2022-07-31 DIAGNOSIS — F4310 Post-traumatic stress disorder, unspecified: Secondary | ICD-10-CM | POA: Diagnosis not present

## 2022-07-31 MED ORDER — PREDNISONE 20 MG PO TABS: 40.0000 mg | ORAL_TABLET | Freq: Every day | ORAL | 0 refills | Status: AC

## 2022-07-31 NOTE — Patient Instructions (Addendum)
It was very nice to see you today!  Take B complex vitamin.  Suggest taking 2 of your lisinopril/hct for blood [facility]  See Dr. Darrelyn Hillock for your back.   Sent prednisone to pharmacy  Can walk in to [facility] on [address] for shingles and covid 9-4 M-F.   Or do at [facility]   PLEASE NOTE:  If you had any lab tests please let us know if you have not heard back within a few days. You may see your results on MyChart before we have a chance to review them but we will give you a call once they are reviewed by [place]. If we ordered any referrals today, please let us know if you have not heard from their office within the next week.   Please try these tips to maintain a healthy lifestyle:  Eat most of your calories during the day when you are active. Eliminate processed foods including packaged sweets (pies, cakes, cookies), reduce intake of potatoes, white bread, white pasta, and white rice. Look for whole grain options, oat flour or almond flour.  Each meal should contain half fruits/vegetables, one quarter protein, and one quarter carbs (no bigger than a computer mouse).  Cut down on sweet beverages. This includes juice, soda, and sweet tea. Also watch fruit intake, though this is a healthier sweet option, it still contains natural sugar! Limit to 3 servings daily.  Drink at least 1 glass of water with each meal and aim for at least 8 glasses per day  Exercise at least 150 minutes every week.

## 2022-07-31 NOTE — Progress Notes (Signed)
Subjective:     Patient ID: Reginald Cos Sr., male    DOB: April 19, 1956, 66 y.o.   MRN: [id number]  Chief Complaint  Patient presents with   Follow-up    6 month follow-up on HTN Would like a refill of prednisone and Tramadol for back pain    HPI  HTN-Pt is on lisinopril/hct 20/12.5.  Bp's running  >140/90's  No cp/palp/edema/cough.  +hard to breathe when lying down-this month. Can be sitting as well. Not when walking.went to UC about 1 mo ago as "equilibrium was off".  Better now.  Not wearing cpap.  HLD-on lipitor 10 Hyperglycemia-not exercising.  Not working on diet.  Chronic LBP-req pred and tramadol.  Can't sleep d/t back pain. Pred helps.  GERD-doing well on meds.  No dysphagia.  No blood.  [facility]  Health Maintenance Due  Topic Date Due   Hepatitis C Screening  Never done   Lung Cancer Screening  Never done   COLONOSCOPY (Pts 45-58yrs Insurance coverage will need to be confirmed)  09/19/2014    Past Medical History:  Diagnosis Date   Anxiety and depression    GERD (gastroesophageal reflux disease)    Hypercholesteremia    Hypertension    Left facial pain    Migraine    PTSD (post-traumatic stress disorder)     History reviewed. No pertinent surgical history.  Outpatient Medications Prior to Visit  Medication Sig Dispense Refill   acetaminophen (TYLENOL) 500 MG tablet Take 1 tablet by mouth 3 (three) times daily as needed.     aspirin EC 81 MG tablet TAKE TWO TABLETS BY MOUTH DAILY FOR HEART     atorvastatin (LIPITOR) 10 MG tablet TAKE ONE TABLET BY MOUTH AT BEDTIME FOR CHOLESTEROL     cyclobenzaprine (FLEXERIL) 10 MG tablet Take 1 tablet (10 mg total) by mouth 2 (two) times daily as needed for muscle spasms. 10 tablet 0   diclofenac (VOLTAREN) 75 MG EC tablet Take 1 tablet (75 mg total) by mouth 2 (two) times daily. 30 tablet 0   fluticasone (FLONASE) 50 MCG/ACT nasal spray INSTILL 1 SPRAY IN EACH NOSTRIL AT BEDTIME     gabapentin  (NEURONTIN) 300 MG capsule Take 1 capsule by mouth at bedtime.     HYDROcodone-acetaminophen (NORCO/VICODIN) 5-325 MG tablet Take 1 tablet by mouth every 6 (six) hours as needed for severe pain. 4 tablet 0   lidocaine (LIDODERM) 5 % APPLY 1 PATCH TO SKIN ONCE DAILY (APPLY FOR 12 HOURS, THEN REMOVE FOR 12 HOURS) 30 patch 0   lisinopril-hydrochlorothiazide (ZESTORETIC) 20-12.5 MG tablet Take 1 tablet by mouth 2 (two) times daily.     loratadine (CLARITIN) 10 MG tablet Take 1 tablet by mouth daily.     meclizine (ANTIVERT) 25 MG tablet Take 1 tablet (25 mg total) by mouth 3 (three) times daily as needed for dizziness. 20 tablet 0   methocarbamol (ROBAXIN) 500 MG tablet Take 1 tablet (500 mg total) by mouth 2 (two) times daily. 20 tablet 0   nortriptyline (PAMELOR) 75 MG capsule TAKE ONE CAPSULE BY MOUTH AT BEDTIME AS NEEDED (DOSE INCREASE)     omeprazole (PRILOSEC) 40 MG capsule TAKE ONE CAPSULE BY MOUTH DAILY (TAKE ON AN EMPTY STOMACH 30 MINUTES PRIOR TO A MEAL)     ondansetron (ZOFRAN-ODT) 4 MG disintegrating tablet 4mg  ODT q4 hours prn nausea/vomit 15 tablet 0   simethicone (MYLICON) 80 MG chewable tablet CHEW TWO TABLETS BY MOUTH DAILY  tamsulosin (FLOMAX) 0.4 MG CAPS capsule Take 1 capsule by mouth at bedtime.     traMADol (ULTRAM) 50 MG tablet Take 50 mg by mouth 3 (three) times daily as needed.     aspirin EC 81 MG tablet Take 81 mg by mouth daily.     atorvastatin (LIPITOR) 20 MG tablet Take 20 mg by mouth daily.     nortriptyline (PAMELOR) 75 MG capsule TAKE ONE CAPSULE BY MOUTH AT BEDTIME AS NEEDED (DOSE INCREASE)     omeprazole (PRILOSEC) 20 MG capsule Take 20 mg by mouth daily.     tamsulosin (FLOMAX) 0.4 MG CAPS capsule Take by mouth.     No facility-administered medications prior to visit.    No Known Allergies ROS neg/noncontributory except as noted HPI/below Occ constipation Depressed this whole month.  No SI.  Has MH      Objective:     BP (!) 136/92 (BP Location:  Left Arm, Patient Position: Sitting, Cuff Size: Large)   Pulse 87   Temp 97.7 F (36.5 C) (Temporal)   Ht 6' (1.829 m)   Wt 202 lb 8 oz (91.9 kg)   SpO2 98%   BMI 27.46 kg/m  Wt Readings from Last 3 Encounters:  07/31/22 202 lb 8 oz (91.9 kg)  06/04/22 190 lb (86.2 kg)  05/23/22 190 lb (86.2 kg)    Physical Exam   Gen: WDWN NAD HEENT: NCAT, conjunctiva not injected, sclera nonicteric NECK:  supple, no thyromegaly, no nodes, no carotid bruits CARDIAC: RRR, S1S2+, no murmur. DP 2+B LUNGS: CTAB. No wheezes ABDOMEN:  BS+, soft, NTND, No HSM, no masses EXT:  no edema MSK: no gross abnormalities.  NEURO: A&O x3.  CN II-XII intact.  PSYCH: normal mood. Good eye contact  Reviewed records [facility] + U[facility] 03/2021.    Pdmp checked     Assessment & Plan:   Problem List Items Addressed This Visit       Cardiovascular and Mediastinum   Essential hypertension     Nervous and Auditory   Lumbosacral radiculitis     Other   PTSD (post-traumatic stress disorder)   Other Visit Diagnoses     Need for immunization against influenza    -  Primary   Relevant Orders   Flu Vaccine QUAD High Dose(Fluad) (Completed)      HTN_chronic-not well controlled.  Advised to increase lisinopril hct to 40/25-f/u [facility] Lumbar radiculopathy-chronic, acute flare.  Advised to f/u ortho/[facility].  I won't rx narcs.  Pred 40mg  daily x 5 days. PTSD-chronic.  [place]-[facility] by [facility]-advised to f/u-pt doesn't want meds thru me as free at [facility] F/u annually  Meds ordered this encounter  Medications   predniSONE (DELTASONE) 20 MG tablet    Sig: Take 2 tablets (40 mg total) by mouth daily with breakfast for 5 days.    Dispense:  10 tablet    Refill:  0    Wellington Hampshire, MD

## 2022-12-03 ENCOUNTER — Other Ambulatory Visit: Payer: Self-pay

## 2022-12-03 ENCOUNTER — Ambulatory Visit (HOSPITAL_COMMUNITY)
Admission: EM | Admit: 2022-12-03 | Discharge: 2022-12-03 | Disposition: A | Discharge status: Home / Self Care | Payer: No Typology Code available for payment source | Attending: Family Medicine | Admitting: Family Medicine

## 2022-12-03 ENCOUNTER — Encounter (HOSPITAL_COMMUNITY): Payer: Self-pay | Admitting: Emergency Medicine

## 2022-12-03 DIAGNOSIS — M5442 Lumbago with sciatica, left side: Secondary | ICD-10-CM | POA: Diagnosis not present

## 2022-12-03 MED ORDER — KETOROLAC TROMETHAMINE 30 MG/ML IJ SOLN
INTRAMUSCULAR | Status: AC
  Filled 2022-12-03: qty 1

## 2022-12-03 MED ORDER — TRAMADOL HCL 50 MG PO TABS: 50.0000 mg | ORAL_TABLET | Freq: Four times a day (QID) | ORAL | 0 refills | Status: DC | PRN

## 2022-12-03 MED ORDER — KETOROLAC TROMETHAMINE 30 MG/ML IJ SOLN
30.0000 mg | Freq: Once | INTRAMUSCULAR | Status: AC
Start: 2022-12-03 — End: 2022-12-03
  Administered 2022-12-03: 30 mg via INTRAMUSCULAR

## 2022-12-03 MED ORDER — PREDNISONE 20 MG PO TABS: ORAL_TABLET | ORAL | 0 refills | Status: DC

## 2022-12-03 NOTE — ED Provider Notes (Signed)
[name]    CSN: [id number] Arrival date & time: 12/03/22  [id number]      History   Chief Complaint Chief Complaint  Patient presents with   Back Pain    HPI Reginald TEVEBAUGH Sr. is a 67 y.o. male.    Back Pain  Here for left low back pain.  Is been bothering him worse in the last 2 days.  It radiates down his left leg into the calf.  No trauma or fall.  No fever or rash or dysuria or hematuria.  Previously he has had treatments with pain management, but he has not seen them in about a year.  Currently he takes gabapentin daily for neuropathy and then he does have some muscle relaxers at home. He does not have diabetes.  No bowel or bladder incontinence and no muscle weakness.  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 No  pertinent surgical history.     Home Medications    Prior to Admission medications   Medication Sig Start Date End Date Taking? Authorizing Provider  predniSONE (DELTASONE) 20 MG tablet 3 tabs daily x3 days, then 2 tabs daily x3 days, then 1 tab daily x3 days, then one half tab daily x3 days, then stop 12/03/22  Yes [place] Joshi, Gwenlyn Perking, MD  traMADol (ULTRAM) 50 MG tablet Take 1 tablet (50 mg total) by mouth every 6 (six) hours as needed (pain). 12/03/22  Yes Barrett Henle, MD  acetaminophen (TYLENOL) 500 MG tablet Take 1 tablet by mouth 3 (three) times daily as needed. 01/19/20   [provider]  aspirin EC 81 MG tablet TAKE TWO TABLETS BY MOUTH DAILY FOR HEART 05/05/22   [provider]  atorvastatin (LIPITOR) 10 MG tablet TAKE ONE TABLET BY MOUTH AT BEDTIME FOR CHOLESTEROL 05/05/22   [provider]  cyclobenzaprine (FLEXERIL) 10 MG tablet Take 1 tablet (10 mg total) by mouth 2 (two) times daily as needed for muscle spasms. 05/14/22   Davonna Belling, MD  fluticasone (FLONASE) 50 MCG/ACT nasal spray INSTILL 1 SPRAY IN EACH NOSTRIL AT BEDTIME 01/19/22   [provider]  gabapentin (NEURONTIN) 300 MG capsule Take 1 capsule by mouth at bedtime. 04/15/21   [provider]  lidocaine (LIDODERM) 5 % APPLY 1 PATCH TO SKIN ONCE DAILY (APPLY FOR 12 HOURS, THEN REMOVE FOR 12 HOURS) 02/10/22   Carlisle Cater, PA-C  lisinopril-hydrochlorothiazide (ZESTORETIC) 20-12.5 MG tablet Take 1 tablet by mouth 2 (two) times daily. 01/19/22   [provider]  loratadine (CLARITIN) 10 MG tablet Take 1 tablet by mouth daily. 07/22/21   [provider]  meclizine (ANTIVERT) 25 MG tablet Take 1 tablet (25 mg total) by mouth 3 (three) times daily as needed for dizziness. 06/04/22   Milton Ferguson, MD  nortriptyline (PAMELOR) 75 MG capsule TAKE ONE CAPSULE BY MOUTH AT BEDTIME AS NEEDED (DOSE INCREASE) 12/30/21   [provider]  omeprazole (PRILOSEC) 40 MG  capsule TAKE ONE CAPSULE BY MOUTH DAILY (TAKE ON AN EMPTY STOMACH 30 MINUTES PRIOR TO A MEAL) 05/05/22   [provider]  simethicone (MYLICON) 80 MG chewable tablet CHEW TWO TABLETS BY MOUTH DAILY 07/21/22   [provider]  tamsulosin (FLOMAX) 0.4 MG CAPS capsule Take 1 capsule by mouth at bedtime. 07/21/22   [provider]  Oxcarbazepine (TRILEPTAL) 300 MG tablet Take 1 tablet (300 mg total) by mouth 2 (two) times daily. 04/12/17 09/04/19  Marcial Pacas, MD  pregabalin (LYRICA) 300 MG capsule Take 1 capsule (300 mg total) by mouth 3 (three) times daily. 04/12/17 08/01/19  Marcial Pacas, MD    Family History Family History  Problem Relation Age of Onset   Diabetes Mother    Hypertension Mother    Kidney failure Mother    Hypertension Father    Prostate cancer Father    Diabetes Sister    Cancer Paternal Grandfather        Lung    Social History Social History   Tobacco Use   Smoking status: Every Day    Packs/day: 1    Types: Cigarettes   Smokeless tobacco: Never  Vaping Use   Vaping Use: Never used  Substance Use Topics   Alcohol use: No    Comment: Quit in 2018   Drug use: Not Currently    Types: "Crack" cocaine, Marijuana     Allergies   Patient has no known allergies.   Review of Systems Review of Systems  Musculoskeletal:  Positive for back pain.     Physical Exam Triage Vital Signs ED Triage Vitals  Enc Vitals Group     BP 12/03/22 0928 (!) 133/96     Pulse Rate 12/03/22 0928 93     Resp 12/03/22 0928 20     Temp 12/03/22 0928 98.2 F (36.8 C)     Temp Source 12/03/22 0928 Oral     SpO2 12/03/22 0928 97 %     Weight --      Height --      Head Circumference --      Peak Flow --      Pain Score 12/03/22 0925 10     Pain Loc --      Pain Edu? --      Excl. in [place]? --    No data found.  Updated Vital Signs BP (!) 133/96 (BP Location: Left Arm)   Pulse 93   Temp 98.2 F (36.8 C) (Oral)   Resp 20   SpO2 97%   Visual  Acuity Right Eye Distance:   Left Eye Distance:   Bilateral Distance:    Right Eye Near:   Left Eye Near:    Bilateral Near:     Physical Exam Vitals reviewed.  Constitutional:      General: He is not in acute distress.    Appearance: He is not ill-appearing, toxic-appearing or diaphoretic.  HENT:  Mouth/Throat:     Mouth: Mucous membranes are moist.     Pharynx: No oropharyngeal exudate or posterior oropharyngeal erythema.  Eyes:     Extraocular Movements: Extraocular movements intact.     Conjunctiva/sclera: Conjunctivae normal.     Pupils: Pupils are equal, round, and reactive to light.  Cardiovascular:     Rate and Rhythm: Normal rate and regular rhythm.     Heart sounds: No murmur heard. Pulmonary:     Effort: Pulmonary effort is normal.     Breath sounds: Normal breath sounds.  Musculoskeletal:     Cervical back: Neck supple.     Comments: Straight leg raise causes radiation of the pain into his left calf.  Lymphadenopathy:     Cervical: No cervical adenopathy.  Skin:    Coloration: Skin is not jaundiced or pale.  Neurological:     General: No focal deficit present.     Mental Status: He is alert and oriented to person, place, and time.  Psychiatric:        Behavior: Behavior normal.      UC Treatments / Results  Labs (all labs ordered are listed, but only abnormal results are displayed) Labs Reviewed - No data to display  EKG   Radiology No results found.  Procedures Procedures (including critical care time)  Medications Ordered in UC Medications  ketorolac (TORADOL) 30 MG/ML injection 30 mg (has no administration in time range)    Initial Impression / Assessment and Plan / UC Course  I have reviewed the triage vital signs and the nursing notes.  Pertinent labs & imaging results that were available during my care of the patient were reviewed by me and considered in my medical decision making (see chart for details).        Not filled  any controlled substances since mid 2023.  Prednisone taper is sent in and then tramadol 3-day supply is sent in.  He will contact his pain management office when he gets home.  He is given 1 injection of Toradol today.  His renal function was normal when last done in epic. Final Clinical Impressions(s) / UC Diagnoses   Final diagnoses:  Acute left-sided low back pain with left-sided sciatica     Discharge Instructions      Take prednisone 20 mg--3 tabs daily x3 days, then 2 tabs daily x3 days, then 1 tab daily x3 days, then one half tab daily x3 days, then stop  Take tramadol 50 mg-- 1 tablet every 6 hours as needed for pain.  This medication can make you sleepy or dizzy  You have been given a shot of Toradol 30 mg today.  Please call your pain management office when you get home today.     ED Prescriptions     Medication Sig Dispense Auth. Provider   traMADol (ULTRAM) 50 MG tablet Take 1 tablet (50 mg total) by mouth every 6 (six) hours as needed (pain). 12 tablet Jamye Balicki, Gwenlyn Perking, MD   predniSONE (DELTASONE) 20 MG tablet 3 tabs daily x3 days, then 2 tabs daily x3 days, then 1 tab daily x3 days, then one half tab daily x3 days, then stop 20 tablet Romayne Ticas, Gwenlyn Perking, MD      I have reviewed the PDMP during this encounter.   Barrett Henle, MD 12/03/22 1011

## 2022-12-03 NOTE — ED Triage Notes (Signed)
Reports history of arthritis in back and is under treatment at [facility] of lower back pain for 1 week.  No pain in legs.    Last night took a pain medicine.  Has not had any medicine for pain today.

## 2022-12-03 NOTE — Discharge Instructions (Signed)
Take prednisone 20 mg--3 tabs daily x3 days, then 2 tabs daily x3 days, then 1 tab daily x3 days, then one half tab daily x3 days, then stop  Take tramadol 50 mg-- 1 tablet every 6 hours as needed for pain.  This medication can make you sleepy or dizzy  You have been given a shot of Toradol 30 mg today.  Please call your pain management office when you get home today.

## 2022-12-24 DIAGNOSIS — H2513 Age-related nuclear cataract, bilateral: Secondary | ICD-10-CM | POA: Diagnosis not present

## 2023-03-17 ENCOUNTER — Encounter (HOSPITAL_COMMUNITY): Payer: Self-pay | Admitting: *Deleted

## 2023-03-17 ENCOUNTER — Emergency Department (HOSPITAL_COMMUNITY)
Admission: EM | Admit: 2023-03-17 | Discharge: 2023-03-17 | Disposition: A | Discharge status: Home / Self Care | Payer: No Typology Code available for payment source | Attending: Emergency Medicine | Admitting: Emergency Medicine

## 2023-03-17 ENCOUNTER — Other Ambulatory Visit: Payer: Self-pay

## 2023-03-17 DIAGNOSIS — I10 Essential (primary) hypertension: Secondary | ICD-10-CM | POA: Diagnosis not present

## 2023-03-17 DIAGNOSIS — Z79899 Other long term (current) drug therapy: Secondary | ICD-10-CM | POA: Diagnosis not present

## 2023-03-17 DIAGNOSIS — Z7982 Long term (current) use of aspirin: Secondary | ICD-10-CM | POA: Diagnosis not present

## 2023-03-17 DIAGNOSIS — M545 Low back pain, unspecified: Secondary | ICD-10-CM | POA: Insufficient documentation

## 2023-03-17 DIAGNOSIS — G8929 Other chronic pain: Secondary | ICD-10-CM | POA: Diagnosis not present

## 2023-03-17 MED ORDER — METHOCARBAMOL 500 MG PO TABS: 500.0000 mg | ORAL_TABLET | Freq: Two times a day (BID) | ORAL | 0 refills | Status: DC

## 2023-03-17 MED ORDER — KETOROLAC TROMETHAMINE 30 MG/ML IJ SOLN
30.0000 mg | Freq: Once | INTRAMUSCULAR | Status: AC
  Administered 2023-03-17: 30 mg via INTRAMUSCULAR
  Filled 2023-03-17: qty 1

## 2023-03-17 NOTE — ED Triage Notes (Signed)
Nontraumatic lower back pain x 1 week, Goes down legs. Pt has a history of back pain injury from years ago.

## 2023-03-17 NOTE — ED Notes (Signed)
PA in room talking with pt at present time.

## 2023-03-17 NOTE — Discharge Instructions (Signed)
Robaxin 500 mg twice daily  Please use Tylenol or ibuprofen for pain.  You may use 600 mg ibuprofen every 6 hours or 1000 mg of Tylenol every 6 hours.  You may choose to alternate between the 2.  This would be most effective.  Not to exceed 4 g of Tylenol within 24 hours.  Not to exceed 3200 mg ibuprofen 24 hours.

## 2023-03-17 NOTE — ED Provider Notes (Signed)
[place] EMERGENCY DEPARTMENT AT [facility] Provider Note   CSN: [id number] Arrival date & time: 03/17/23  0746     History  Chief Complaint  Patient presents with   Back Pain    Reginald [facility] Sr. is a 67 y.o. male.   Back Pain Patient is a 67 year old male with past medical history significant for chronic back pain has a history of hypertension, high cholesterol, anxiety depression  He presents emergency room today with complaints of low back pain for the past week he states that he is back pain for many years but that it seems to flareup intermittently.  He states today it seems to be achy primarily in the low back he indicates that he has pain that radiates across his low back but does not seem to radiate significantly down his legs.  Denies any bowel or bladder incontinence, saddle anesthesia, numbness or weakness in his legs. No falls or new injuries.    Home Medications Prior to Admission medications   Medication Sig Start Date End Date Taking? Authorizing Provider  methocarbamol (ROBAXIN) 500 MG tablet Take 1 tablet (500 mg total) by mouth 2 (two) times daily. 03/17/23  Yes Adisyn Ruscitti, Stevphen Meuse S, PA  acetaminophen (TYLENOL) 500 MG tablet Take 1 tablet by mouth 3 (three) times daily as needed. 01/19/20   [provider]  aspirin EC 81 MG tablet TAKE TWO TABLETS BY MOUTH DAILY FOR HEART 05/05/22   [provider]  atorvastatin (LIPITOR) 10 MG tablet TAKE ONE TABLET BY MOUTH AT BEDTIME FOR CHOLESTEROL 05/05/22   [provider]  cyclobenzaprine (FLEXERIL) 10 MG tablet Take 1 tablet (10 mg total) by mouth 2 (two) times daily as needed for muscle spasms. 05/14/22   Benjiman Core, MD  fluticasone (FLONASE) 50 MCG/ACT nasal spray INSTILL 1 SPRAY IN EACH NOSTRIL AT BEDTIME 01/19/22   [provider]  gabapentin (NEURONTIN) 300 MG capsule Take 1 capsule by mouth at bedtime. 04/15/21   [provider]  lidocaine (LIDODERM) 5 % APPLY 1  PATCH TO SKIN ONCE DAILY (APPLY FOR 12 HOURS, THEN REMOVE FOR 12 HOURS) 02/10/22   Renne Crigler, PA-C  lisinopril-hydrochlorothiazide (ZESTORETIC) 20-12.5 MG tablet Take 1 tablet by mouth 2 (two) times daily. 01/19/22   [provider]  loratadine (CLARITIN) 10 MG tablet Take 1 tablet by mouth daily. 07/22/21   [provider]  meclizine (ANTIVERT) 25 MG tablet Take 1 tablet (25 mg total) by mouth 3 (three) times daily as needed for dizziness. 06/04/22   Bethann Berkshire, MD  nortriptyline (PAMELOR) 75 MG capsule TAKE ONE CAPSULE BY MOUTH AT BEDTIME AS NEEDED (DOSE INCREASE) 12/30/21   [provider]  omeprazole (PRILOSEC) 40 MG capsule TAKE ONE CAPSULE BY MOUTH DAILY (TAKE ON AN EMPTY STOMACH 30 MINUTES PRIOR TO A MEAL) 05/05/22   [provider]  predniSONE (DELTASONE) 20 MG tablet 3 tabs daily x3 days, then 2 tabs daily x3 days, then 1 tab daily x3 days, then one half tab daily x3 days, then stop 12/03/22   Zenia Resides, MD  simethicone (MYLICON) 80 MG chewable tablet CHEW TWO TABLETS BY MOUTH DAILY 07/21/22   [provider]  tamsulosin (FLOMAX) 0.4 MG CAPS capsule Take 1 capsule by mouth at bedtime. 07/21/22   [provider]  traMADol (ULTRAM) 50 MG tablet Take 1 tablet (50 mg total) by mouth every 6 (six) hours as needed (pain). 12/03/22   Zenia Resides, MD  Oxcarbazepine (TRILEPTAL) 300  MG tablet Take 1 tablet (300 mg total) by mouth 2 (two) times daily. 04/12/17 09/04/19  Levert Feinstein, MD  pregabalin (LYRICA) 300 MG capsule Take 1 capsule (300 mg total) by mouth 3 (three) times daily. 04/12/17 08/01/19  Levert Feinstein, MD      Allergies    Patient has no known allergies.    Review of Systems   Review of Systems  Musculoskeletal:  Positive for back pain.    Physical Exam Updated Vital Signs BP (!) 159/110 (BP Location: Right Arm)   Pulse 75   Temp 98.1 F (36.7 C) (Oral)   Resp 16   Ht 6' (1.829 m)   Wt 86.2 kg   SpO2 98%   BMI  25.77 kg/m  Physical Exam Vitals and nursing note reviewed.  Constitutional:      General: He is not in acute distress. HENT:     Head: Normocephalic and atraumatic.     Nose: Nose normal.     Mouth/Throat:     Mouth: Mucous membranes are moist.  Eyes:     General: No scleral icterus. Cardiovascular:     Rate and Rhythm: Normal rate and regular rhythm.     Pulses: Normal pulses.     Heart sounds: Normal heart sounds.  Pulmonary:     Effort: Pulmonary effort is normal. No respiratory distress.     Breath sounds: No wheezing.  Abdominal:     Palpations: Abdomen is soft.     Tenderness: There is no abdominal tenderness.  Musculoskeletal:     Cervical back: Normal range of motion.     Right lower leg: No edema.     Left lower leg: No edema.     Comments: No focal bony tenderness of the C, T, L-spine  Extremities with good strength and normal patellar reflexes bilaterally sensation normal in bilateral lower extremities  Skin:    General: Skin is warm and dry.     Capillary Refill: Capillary refill takes less than 2 seconds.  Neurological:     Mental Status: He is alert. Mental status is at baseline.  Psychiatric:        Mood and Affect: Mood normal.        Behavior: Behavior normal.     ED Results / Procedures / Treatments   Labs (all labs ordered are listed, but only abnormal results are displayed) Labs Reviewed - No data to display  EKG None  Radiology No results found.  Procedures Procedures    Medications Ordered in ED Medications  ketorolac (TORADOL) 30 MG/ML injection 30 mg (30 mg Intramuscular Given 03/17/23 0943)    ED Course/ Medical Decision Making/ A&P                             Medical Decision Making Risk Prescription drug management.   Patient is a 67 year old male with past medical history significant for chronic back pain has a history of hypertension, high cholesterol, anxiety depression  He presents emergency room today with  complaints of low back pain for the past week he states that he is back pain for many years but that it seems to flareup intermittently.  He states today it seems to be achy primarily in the low back he indicates that he has pain that radiates across his low back but does not seem to radiate significantly down his legs.  Denies any bowel or bladder incontinence, saddle anesthesia, numbness or weakness  in his legs. No falls or new injuries.  Patient has reassuring physical exam.  This is nontraumatic low back pain that seems to be chronic in nature.  Given Toradol and discharged home with Robaxin.  Patient has no red flag symptoms for cauda equina.  Symptoms seem to be consistent with prior back pain flareups.  Will discharge home at this time.  Final Clinical Impression(s) / ED Diagnoses Final diagnoses:  Chronic low back pain, unspecified back pain laterality, unspecified whether sciatica present    Rx / DC Orders ED Discharge Orders          Ordered    methocarbamol (ROBAXIN) 500 MG tablet  2 times daily        03/17/23 0940              Gailen Shelter, PA 03/17/23 1424    Jacalyn Lefevre, MD 03/17/23 1439

## 2023-03-23 ENCOUNTER — Telehealth: Payer: Self-pay

## 2023-03-23 NOTE — Telephone Encounter (Signed)
Transition Care Management Follow-up Telephone Call Date of discharge and from where: 03/17/2023 [facility] How have you been since you were released from the hospital? Patient is feeling better. Any questions or concerns? No  Items Reviewed: Did the pt receive and understand the discharge instructions provided? Yes  Medications obtained and verified? Yes  Other? No  Any new allergies since your discharge? No  Dietary orders reviewed? Yes Do you have support at home? Yes   Follow up appointments reviewed:  PCP Hospital f/u appt confirmed? Yes  Scheduled to see  on 04/08/2023 @ [facility]. Specialist Hospital f/u appt confirmed? No  Scheduled to see  on  @ . Are transportation arrangements needed? No  If their condition worsens, is the pt aware to call PCP or go to the Emergency Dept.? Yes Was the patient provided with contact information for the PCP's office or ED? Yes Was to pt encouraged to call back with questions or concerns? Yes  [facility]  [facility] Population Health Community Resource Care Guide   ??[email or web address]  ?? [phone]   Website: [email or web address]

## 2023-04-12 ENCOUNTER — Ambulatory Visit (INDEPENDENT_AMBULATORY_CARE_PROVIDER_SITE_OTHER): Payer: Medicare PPO

## 2023-04-12 VITALS — Wt 190.0 lb

## 2023-04-12 DIAGNOSIS — Z122 Encounter for screening for malignant neoplasm of respiratory organs: Secondary | ICD-10-CM | POA: Diagnosis not present

## 2023-04-12 DIAGNOSIS — Z0000 Encounter for general adult medical examination without abnormal findings: Secondary | ICD-10-CM | POA: Diagnosis not present

## 2023-04-12 NOTE — Patient Instructions (Signed)
[name] , Thank you for taking time to come for your Medicare Wellness Visit. I appreciate your ongoing commitment to your health goals. Please review the following plan we discussed and let me know if I can assist you in the future.   Referrals/Orders/Follow-Ups/Clinician Recommendations: get rid off back pain to get back to the gym, pt aware of immunizations due   This is a list of the screening recommended for you and due dates:  Health Maintenance  Topic Date Due   Hepatitis C Screening  Never done   Zoster (Shingles) Vaccine (1 of 2) 06/15/1974   Screening for Lung Cancer  Never done   COVID-19 Vaccine (7 - 2023-24 season) 05/15/2022   Flu Shot  04/15/2023   Medicare Annual Wellness Visit  04/11/2024   DTaP/Tdap/Td vaccine (3 - Td or Tdap) 07/23/2026   Colon Cancer Screening  06/11/2032   Pneumonia Vaccine  Completed   HPV Vaccine  Aged Out    Advanced directives: (Declined) Advance directive discussed with you today. Even though you declined this today, please call our office should you change your mind, and we can give you the proper paperwork for you to fill out.  Next Medicare Annual Wellness Visit scheduled for next year: yes   Preventive Care 16 Years and Older, Male  Preventive care refers to lifestyle choices and visits with your health care provider that can promote health and wellness. What does preventive care include? A yearly physical exam. This is also called an annual well check. Dental exams once or twice a year. Routine eye exams. Ask your health care provider how often you should have your eyes checked. Personal lifestyle choices, including: Daily care of your teeth and gums. Regular physical activity. Eating a healthy diet. Avoiding tobacco and drug use. Limiting alcohol use. Practicing safe sex. Taking low doses of aspirin every day. Taking vitamin and mineral supplements as recommended by your health care provider. What happens during an annual well  check? The services and screenings done by your health care provider during your annual well check will depend on your age, overall health, lifestyle risk factors, and family history of disease. Counseling  Your health care provider may ask you questions about your: Alcohol use. Tobacco use. Drug use. Emotional well-being. Home and relationship well-being. Sexual activity. Eating habits. History of falls. Memory and ability to understand (cognition). Work and work Astronomer. Screening  You may have the following tests or measurements: Height, weight, and BMI. Blood pressure. Lipid and cholesterol levels. These may be checked every 5 years, or more frequently if you are over 83 years old. Skin check. Lung cancer screening. You may have this screening every year starting at age 61 if you have a 30-pack-year history of smoking and currently smoke or have quit within the past 15 years. Fecal occult blood test (FOBT) of the stool. You may have this test every year starting at age 46. Flexible sigmoidoscopy or colonoscopy. You may have a sigmoidoscopy every 5 years or a colonoscopy every 10 years starting at age 49. Prostate cancer screening. Recommendations will vary depending on your family history and other risks. Hepatitis C blood test. Hepatitis B blood test. Sexually transmitted disease (STD) testing. Diabetes screening. This is done by checking your blood sugar (glucose) after you have not eaten for a while (fasting). You may have this done every 1-3 years. Abdominal aortic aneurysm (AAA) screening. You may need this if you are a current or former smoker. Osteoporosis. You may be  screened starting at age 89 if you are at high risk. Talk with your health care provider about your test results, treatment options, and if necessary, the need for more tests. Vaccines  Your health care provider may recommend certain vaccines, such as: Influenza vaccine. This is recommended every  year. Tetanus, diphtheria, and acellular pertussis (Tdap, Td) vaccine. You may need a Td booster every 10 years. Zoster vaccine. You may need this after age 70. Pneumococcal 13-valent conjugate (PCV13) vaccine. One dose is recommended after age 72. Pneumococcal polysaccharide (PPSV23) vaccine. One dose is recommended after age 84. Talk to your health care provider about which screenings and vaccines you need and how often you need them. This information is not intended to replace advice given to you by your health care provider. Make sure you discuss any questions you have with your health care provider. Document Released: 09/27/2015 Document Revised: 05/20/2016 Document Reviewed: 07/02/2015 Elsevier Interactive Patient Education  2017 [employer].  Fall Prevention in the Home Falls can cause injuries. They can happen to people of all ages. There are many things you can do to make your home safe and to help prevent falls. What can I do on the outside of my home? Regularly fix the edges of walkways and driveways and fix any cracks. Remove anything that might make you trip as you walk through a door, such as a raised step or threshold. Trim any bushes or trees on the path to your home. Use bright outdoor lighting. Clear any walking paths of anything that might make someone trip, such as rocks or tools. Regularly check to see if handrails are loose or broken. Make sure that both sides of any steps have handrails. Any raised decks and porches should have guardrails on the edges. Have any leaves, snow, or ice cleared regularly. Use sand or salt on walking paths during winter. Clean up any spills in your garage right away. This includes oil or grease spills. What can I do in the bathroom? Use night lights. Install grab bars by the toilet and in the tub and shower. Do not use towel bars as grab bars. Use non-skid mats or decals in the tub or shower. If you need to sit down in the shower, use a  plastic, non-slip stool. Keep the floor dry. Clean up any water that spills on the floor as soon as it happens. Remove soap buildup in the tub or shower regularly. Attach bath mats securely with double-sided non-slip rug tape. Do not have throw rugs and other things on the floor that can make you trip. What can I do in the bedroom? Use night lights. Make sure that you have a light by your bed that is easy to reach. Do not use any sheets or blankets that are too big for your bed. They should not hang down onto the floor. Have a firm chair that has side arms. You can use this for support while you get dressed. Do not have throw rugs and other things on the floor that can make you trip. What can I do in the kitchen? Clean up any spills right away. Avoid walking on wet floors. Keep items that you use a lot in easy-to-reach places. If you need to reach something above you, use a strong step stool that has a grab bar. Keep electrical cords out of the way. Do not use floor polish or wax that makes floors slippery. If you must use wax, use non-skid floor wax. Do not have  throw rugs and other things on the floor that can make you trip. What can I do with my stairs? Do not leave any items on the stairs. Make sure that there are handrails on both sides of the stairs and use them. Fix handrails that are broken or loose. Make sure that handrails are as long as the stairways. Check any carpeting to make sure that it is firmly attached to the stairs. Fix any carpet that is loose or worn. Avoid having throw rugs at the top or bottom of the stairs. If you do have throw rugs, attach them to the floor with carpet tape. Make sure that you have a light switch at the top of the stairs and the bottom of the stairs. If you do not have them, ask someone to add them for you. What else can I do to help prevent falls? Wear shoes that: Do not have high heels. Have rubber bottoms. Are comfortable and fit you  well. Are closed at the toe. Do not wear sandals. If you use a stepladder: Make sure that it is fully opened. Do not climb a closed stepladder. Make sure that both sides of the stepladder are locked into place. Ask someone to hold it for you, if possible. Clearly mark and make sure that you can see: Any grab bars or handrails. First and last steps. Where the edge of each step is. Use tools that help you move around (mobility aids) if they are needed. These include: Canes. Walkers. Scooters. Crutches. Turn on the lights when you go into a dark area. Replace any light bulbs as soon as they burn out. Set up your furniture so you have a clear path. Avoid moving your furniture around. If any of your floors are uneven, fix them. If there are any pets around you, be aware of where they are. Review your medicines with your doctor. Some medicines can make you feel dizzy. This can increase your chance of falling. Ask your doctor what other things that you can do to help prevent falls. This information is not intended to replace advice given to you by your health care provider. Make sure you discuss any questions you have with your health care provider. Document Released: 06/27/2009 Document Revised: 02/06/2016 Document Reviewed: 10/05/2014 Elsevier Interactive Patient Education  2017 [employer].

## 2023-04-12 NOTE — Progress Notes (Addendum)
Subjective:   Reginald SFERRAZZA Sr. is a 67 y.o. male who presents for Medicare Annual/Subsequent preventive examination.  Vital Signs: Unable to obtain new vitals due to this being a telehealth visit.   Visit Complete: Virtual  I connected with  Reginald Cos Sr. on 04/12/23 by a audio enabled telemedicine application and verified that I am speaking with the correct person using two identifiers.  Patient Location: Home  Provider Location: Office/Clinic  I discussed the limitations of evaluation and management by telemedicine. The patient expressed understanding and agreed to proceed.  Review of Systems     Cardiac Risk Factors include: advanced age (>97men, >24 women);dyslipidemia;smoking/ tobacco exposure;hypertension;male gender     Objective:    Today's Vitals   04/12/23 1218 04/12/23 1219  Weight: 190 lb (86.2 kg)   PainSc:  9    Body mass index is 25.77 kg/m.     04/12/2023   12:23 PM 03/17/2023    7:58 AM 06/04/2022   10:11 AM 05/14/2022    3:02 AM 03/26/2022    3:30 PM 02/15/2022    4:35 AM 02/13/2022    6:32 AM  Advanced Directives  Does Patient Have a Medical Advance Directive? No No No No No No No  Would patient like information on creating a medical advance directive? No - Patient declined  No - Patient declined No - Patient declined Yes (MAU/Ambulatory/Procedural Areas - Information given) No - Patient declined No - Patient declined    Current Medications (verified) Outpatient Encounter Medications as of 04/12/2023  Medication Sig   acetaminophen (TYLENOL) 500 MG tablet Take 1 tablet by mouth 3 (three) times daily as needed.   aspirin EC 81 MG tablet TAKE TWO TABLETS BY MOUTH DAILY FOR HEART   atorvastatin (LIPITOR) 10 MG tablet TAKE ONE TABLET BY MOUTH AT BEDTIME FOR CHOLESTEROL   fluticasone (FLONASE) 50 MCG/ACT nasal spray INSTILL 1 SPRAY IN EACH NOSTRIL AT BEDTIME   gabapentin (NEURONTIN) 300 MG capsule Take 1 capsule by mouth at bedtime.   lidocaine  (LIDODERM) 5 % APPLY 1 PATCH TO SKIN ONCE DAILY (APPLY FOR 12 HOURS, THEN REMOVE FOR 12 HOURS)   lisinopril-hydrochlorothiazide (ZESTORETIC) 20-12.5 MG tablet Take 1 tablet by mouth 2 (two) times daily.   loratadine (CLARITIN) 10 MG tablet Take 1 tablet by mouth daily.   meclizine (ANTIVERT) 25 MG tablet Take 1 tablet (25 mg total) by mouth 3 (three) times daily as needed for dizziness.   nortriptyline (PAMELOR) 75 MG capsule TAKE ONE CAPSULE BY MOUTH AT BEDTIME AS NEEDED (DOSE INCREASE)   omeprazole (PRILOSEC) 40 MG capsule TAKE ONE CAPSULE BY MOUTH DAILY (TAKE ON AN EMPTY STOMACH 30 MINUTES PRIOR TO A MEAL)   simethicone (MYLICON) 80 MG chewable tablet CHEW TWO TABLETS BY MOUTH DAILY   tamsulosin (FLOMAX) 0.4 MG CAPS capsule Take 1 capsule by mouth at bedtime.   traMADol (ULTRAM) 50 MG tablet Take 1 tablet (50 mg total) by mouth every 6 (six) hours as needed (pain). (Patient not taking: Reported on 04/12/2023)   [DISCONTINUED] cyclobenzaprine (FLEXERIL) 10 MG tablet Take 1 tablet (10 mg total) by mouth 2 (two) times daily as needed for muscle spasms.   [DISCONTINUED] methocarbamol (ROBAXIN) 500 MG tablet Take 1 tablet (500 mg total) by mouth 2 (two) times daily.   [DISCONTINUED] Oxcarbazepine (TRILEPTAL) 300 MG tablet Take 1 tablet (300 mg total) by mouth 2 (two) times daily.   [DISCONTINUED] predniSONE (DELTASONE) 20 MG tablet 3 tabs daily x3 days, then  2 tabs daily x3 days, then 1 tab daily x3 days, then one half tab daily x3 days, then stop   [DISCONTINUED] pregabalin (LYRICA) 300 MG capsule Take 1 capsule (300 mg total) by mouth 3 (three) times daily.   No facility-administered encounter medications on file as of 04/12/2023.    Allergies (verified) Patient has no known allergies.   History: Past Medical History:  Diagnosis Date   Anxiety and depression    GERD (gastroesophageal reflux disease)    Hypercholesteremia    Hypertension    Left facial pain    Migraine    PTSD  (post-traumatic stress disorder)    History reviewed. No pertinent surgical history. Family History  Problem Relation Age of Onset   Diabetes Mother    Hypertension Mother    Kidney failure Mother    Hypertension Father    Prostate cancer Father    Diabetes Sister    Cancer Paternal Grandfather        Lung   Social History   Socioeconomic History   Marital status: Married    Spouse name: Not on file   Number of children: 2   Years of education: HS   Highest education level: Not on file  Occupational History   Occupation: Disabled  Tobacco Use   Smoking status: Every Day    Current packs/day: 1.00    Types: Cigarettes   Smokeless tobacco: Never  Vaping Use   Vaping status: Never Used  Substance and Sexual Activity   Alcohol use: No    Comment: Quit in 2018   Drug use: Not Currently    Types: "Crack" cocaine, Marijuana   Sexual activity: Yes  Other Topics Concern   Not on file  Social History Narrative   Lives at home with wife and two sons.   Right-handed.   3 cups caffeine per day.   Social Determinants of Health   Financial Resource Strain: Low Risk  (03/26/2022)   Overall Financial Resource Strain (CARDIA)    Difficulty of Paying Living Expenses: Not hard at all  Food Insecurity: No Food Insecurity (03/26/2022)   Hunger Vital Sign    Worried About Running Out of Food in the Last Year: Never true    Ran Out of Food in the Last Year: Never true  Transportation Needs: No Transportation Needs (03/26/2022)   PRAPARE - Administrator, Civil Service (Medical): No    Lack of Transportation (Non-Medical): No  Physical Activity: Inactive (04/12/2023)   Exercise Vital Sign    Days of Exercise per Week: 0 days    Minutes of Exercise per Session: 0 min  Stress: No Stress Concern Present (04/12/2023)   [employer] of Occupational Health - Occupational Stress Questionnaire    Feeling of Stress : Not at all  Social Connections: Moderately Integrated  (03/26/2022)   Social Connection and Isolation Panel [NHANES]    Frequency of Communication with Friends and Family: Twice a week    Frequency of Social Gatherings with Friends and Family: Three times a week    Attends Religious Services: More than 4 times per year    Active Member of Clubs or Organizations: No    Attends Banker Meetings: Never    Marital Status: Married    Tobacco Counseling Ready to quit: Not Answered Counseling given: Not Answered   Clinical Intake:  Pre-visit preparation completed: Yes  Pain : 0-10 Pain Score: 9  Pain Type: Acute pain Pain Location: Head Pain Descriptors /  Indicators: Aching Pain Onset: Yesterday     BMI - recorded: 25.77 Nutritional Status: BMI 25 -29 Overweight Nutritional Risks: None Diabetes: No  How often do you need to have someone help you when you read instructions, pamphlets, or other written materials from your doctor or pharmacy?: 1 - Never  Interpreter Needed?: No  Information entered by :: Lanier Ensign, LPN   Activities of Daily Living    04/12/2023   12:21 PM  In your present state of health, do you have any difficulty performing the following activities:  Hearing? 0  Vision? 0  Difficulty concentrating or making decisions? 0  Walking or climbing stairs? 0  Dressing or bathing? 0  Doing errands, shopping? 0  Preparing Food and eating ? N  Using the Toilet? N  In the past six months, have you accidently leaked urine? N  Do you have problems with loss of bowel control? N  Managing your Medications? N  Managing your Finances? N  Housekeeping or managing your Housekeeping? N    Patient Care Team: Jeani Sow, MD as PCP - General (Family Medicine) Nelda Marseille, DMD (Oral Surgery) Allena Katz, Janus Delene Ruffini, MD as Referring Physician (Anesthesiology) Flynt, Shaune Pollack, MD as Referring Physician (Pain Medicine)  Indicate any recent Medical Services you may have received from other  than [facility] providers in the past year (date may be approximate).     Assessment:   This is a routine wellness examination for [place].  Hearing/Vision screen Hearing Screening - Comments:: Pt wears hearing  aids  Vision Screening - Comments:: [facility] for annual [facility] exams   Dietary issues and exercise activities discussed:     Goals Addressed             This Visit's Progress    Patient Stated       Get rid of back pain        Depression Screen    04/12/2023   12:24 PM 07/31/2022    1:39 PM 03/26/2022    3:28 PM 01/28/2022    2:26 PM 09/26/2021    3:20 PM  PHQ 2/9 Scores  PHQ - 2 Score 2 3 0 4 1  PHQ- 9 Score 10 16 0 13     Fall Risk    04/12/2023   12:26 PM 03/26/2022    3:31 PM 01/28/2022    1:50 PM 09/26/2021    3:20 PM 03/01/2017   11:42 AM  Fall Risk   Falls in the past year? 0 0 0 0 No  Number falls in past yr: 0 0 0 0   Injury with Fall? 0 0 0 0   Risk for fall due to : Impaired vision Impaired vision Other (Comment) History of fall(s)   Risk for fall due to: Comment   uses a cane    Follow up Falls prevention discussed Falls prevention discussed  Falls evaluation completed     MEDICARE RISK AT HOME:  Medicare Risk at Home - 04/12/23 1226     Any stairs in or around the home? No    If so, are there any without handrails? No    Home free of loose throw rugs in walkways, pet beds, electrical cords, etc? Yes    Adequate lighting in your home to reduce risk of falls? Yes    Life alert? No    Use of a cane, walker or w/c? Yes    Grab bars in the bathroom? No  Shower chair or bench in shower? No    Elevated toilet seat or a handicapped toilet? No             TIMED UP AND GO:  Was the test performed?  No    Cognitive Function:DECLINED         03/26/2022    3:33 PM  6CIT Screen  What Year? 0 points  What month? 0 points  What time? 0 points  Count back from 20 0 points  Months in reverse 2 points  Repeat phrase 2  points  Total Score 4 points    Immunizations Immunization History  Administered Date(s) Administered   Fluad Quad(high Dose 65+) 10/08/2020, 08/12/2021, 07/31/2022   H1N1 09/14/2008   Influenza-Unspecified 08/12/2021, 07/31/2022   Moderna Covid-19 Vaccine Bivalent Booster 46yrs & up 08/02/2021   PFIZER Comirnaty(Gray Top)Covid-19 Tri-Sucrose Vaccine 02/20/2021   PFIZER(Purple Top)SARS-COV-2 Vaccination 12/11/2019, 01/02/2020, 08/04/2020, 02/20/2021   PNEUMOCOCCAL CONJUGATE-20 09/26/2021   Pneumococcal Polysaccharide-23 11/25/2011   Tdap 07/23/2010, 07/23/2016   Zoster, Live 08/28/2015    TDAP status: Up to date  Flu Vaccine status: Up to date  Pneumococcal vaccine status: Up to date  Covid-19 vaccine status: Completed vaccines  Qualifies for Shingles Vaccine? Yes   Zostavax completed No   Shingrix Completed?: No.    Education has been provided regarding the importance of this vaccine. Patient has been advised to call insurance company to determine out of pocket expense if they have not yet received this vaccine. Advised may also receive vaccine at local pharmacy or Health Dept. Verbalized acceptance and understanding.  Screening Tests Health Maintenance  Topic Date Due   Hepatitis C Screening  Never done   Zoster Vaccines- Shingrix (1 of 2) 06/15/1974   Lung Cancer Screening  Never done   COVID-19 Vaccine (7 - 2023-24 season) 05/15/2022   INFLUENZA VACCINE  04/15/2023   Medicare Annual Wellness (AWV)  04/11/2024   DTaP/Tdap/Td (3 - Td or Tdap) 07/23/2026   Colonoscopy  06/11/2032   Pneumonia Vaccine 40+ Years old  Completed   HPV VACCINES  Aged Out    Health Maintenance  Health Maintenance Due  Topic Date Due   Hepatitis C Screening  Never done   Zoster Vaccines- Shingrix (1 of 2) 06/15/1974   Lung Cancer Screening  Never done   COVID-19 Vaccine (7 - 2023-24 season) 05/15/2022    Colorectal cancer screening: Type of screening: Colonoscopy. Completed 06/11/22.  Repeat every 10 years  Lung Cancer Screening: (Low Dose CT Chest recommended if Age 64-80 years, 20 pack-year currently smoking OR have quit w/in 15years.) does qualify.   Lung Cancer Screening Referral: order placed 04/12/23  Additional Screening:  Hepatitis C Screening: does qualify;  Vision Screening: Recommended annual ophthalmology exams for early detection of glaucoma and other disorders of the eye. Is the patient up to date with their annual eye exam?  Yes  Who is the provider or what is the name of the office in which the patient attends annual eye exams? [facility]  If pt is not established with a provider, would they like to be referred to a provider to establish care? No .   Dental Screening: Recommended annual dental exams for proper oral hygiene   Community Resource Referral / Chronic Care Management: CRR required this visit?  No   CCM required this visit?  No     Plan:     I have personally reviewed and noted the following in the patient's chart:  Medical and social history Use of alcohol, tobacco or illicit drugs  Current medications and supplements including opioid prescriptions. Patient is not currently taking opioid prescriptions. Functional ability and status Nutritional status Physical activity Advanced directives List of other physicians Hospitalizations, surgeries, and ER visits in previous 12 months Vitals Screenings to include cognitive, depression, and falls Referrals and appointments  In addition, I have reviewed and discussed with patient certain preventive protocols, quality metrics, and best practice recommendations. A written personalized care plan for preventive services as well as general preventive health recommendations were provided to patient.     Marzella Schlein, LPN   05/12/5620   After Visit Summary: (Declined) Due to this being a telephonic visit, with patients personalized plan was offered to patient but patient Declined  AVS at this time   Nurse Notes: pt stated he had a headache and declined cognition at this time, pt was knowledgeable to questions asked

## 2023-05-04 ENCOUNTER — Telehealth: Payer: Self-pay | Admitting: Family Medicine

## 2023-05-04 NOTE — Telephone Encounter (Signed)
Patient dropped off document Handicap Placard, to be filled out by provider. Patient requested to send it back via Call Patient to pick up within 5-days. Document is located in providers tray at front office.Please advise at Mobile [phone] (mobile)

## 2023-05-04 NOTE — Telephone Encounter (Signed)
Form placed on providers desk 

## 2023-05-05 NOTE — Telephone Encounter (Signed)
Form completed and placed up front for pick-up. Left patient detailed message.

## 2023-06-11 ENCOUNTER — Other Ambulatory Visit: Payer: Self-pay | Admitting: Emergency Medicine

## 2023-06-11 DIAGNOSIS — Z122 Encounter for screening for malignant neoplasm of respiratory organs: Secondary | ICD-10-CM

## 2023-06-11 DIAGNOSIS — Z87891 Personal history of nicotine dependence: Secondary | ICD-10-CM

## 2023-06-11 DIAGNOSIS — F17210 Nicotine dependence, cigarettes, uncomplicated: Secondary | ICD-10-CM

## 2023-06-15 NOTE — Progress Notes (Unsigned)
  Virtual Visit via Telephone Note  I connected with Reginald Cos Sr., 06/15/23 11:38 AM by a telemedicine application and verified that I am speaking with the correct person using two identifiers.  Location: Patient: home Provider: home    I discussed the limitations of evaluation and management by telemedicine and the availability of in person appointments. The patient expressed understanding and agreed to proceed.   Shared Decision Making Visit Lung Cancer Screening Program [phone])   Eligibility: 68 y.o. Pack Years Smoking History Calculation =24 (# packs/per year x # years smoked) 75yrs x 1/2ppd  Recent History of coughing up blood  no Unexplained weight loss? no ( >Than 15 pounds within the last 6 months ) Prior History Lung / other cancer no (Diagnosis within the last 5 years already requiring surveillance chest CT Scans). Smoking Status Current Smoker  Visit Components: Discussion included one or more decision making aids. YES Discussion included risk/benefits of screening. YES Discussion included potential follow up diagnostic testing for abnormal scans. YES Discussion included meaning and risk of over diagnosis. YES Discussion included meaning and risk of False Positives. YES Discussion included meaning of total radiation exposure. YES  Counseling Included: Importance of adherence to annual lung cancer LDCT screening. YES Impact of comorbidities on ability to participate in the program. YES Ability and willingness to under diagnostic treatment. YES  Smoking Cessation Counseling: Current Smokers:  Discussed importance of smoking cessation. yes Information about tobacco cessation classes and interventions provided to patient. yes Patient provided with "ticket" for LDCT Scan. yes Asymptomatic Patient yes  Counseling (Intermediate counseling: > three minutes counseling) [id number] Patient provided with "ticket" for LDCT Scan. Yes  - scheduled 10/7 330pm Written  Order for Lung Cancer Screening with LDCT placed in Epic. Yes (CT Chest Lung Cancer Screening Low Dose W/O CM) [id number]  Z12.2-Screening of respiratory organs Z87.891-Personal history of nicotine dependence   Reginald Burke 06/15/23

## 2023-06-16 ENCOUNTER — Ambulatory Visit (INDEPENDENT_AMBULATORY_CARE_PROVIDER_SITE_OTHER): Payer: Medicare PPO | Admitting: Adult Health

## 2023-06-16 ENCOUNTER — Encounter: Payer: Self-pay | Admitting: Adult Health

## 2023-06-16 DIAGNOSIS — F17210 Nicotine dependence, cigarettes, uncomplicated: Secondary | ICD-10-CM

## 2023-06-16 NOTE — Patient Instructions (Signed)

## 2023-06-21 ENCOUNTER — Ambulatory Visit (HOSPITAL_COMMUNITY)
Admission: RE | Admit: 2023-06-21 | Discharge: 2023-06-21 | Disposition: A | Discharge status: Home / Self Care | Payer: Medicare PPO | Source: Ambulatory Visit | Attending: Acute Care | Admitting: Acute Care

## 2023-06-21 DIAGNOSIS — Z122 Encounter for screening for malignant neoplasm of respiratory organs: Secondary | ICD-10-CM

## 2023-06-21 DIAGNOSIS — Z87891 Personal history of nicotine dependence: Secondary | ICD-10-CM

## 2023-06-21 DIAGNOSIS — F17210 Nicotine dependence, cigarettes, uncomplicated: Secondary | ICD-10-CM | POA: Diagnosis not present

## 2023-06-30 ENCOUNTER — Telehealth: Payer: Self-pay | Admitting: Family Medicine

## 2023-06-30 NOTE — Telephone Encounter (Signed)
Form placed on providers desk 

## 2023-06-30 NOTE — Telephone Encounter (Signed)
Patient dropped off document Handicap Placard, to be filled out by provider. Patient requested to send it back via Call Patient to pick up within 2-days. Document is located in providers tray at front office.Please advise at Mobile [phone] (mobile)

## 2023-07-01 NOTE — Telephone Encounter (Signed)
Patient informed that form is ready for pick-up and placed up front, stated he would be by today to pick-up.

## 2023-07-07 ENCOUNTER — Telehealth: Payer: Self-pay | Admitting: Acute Care

## 2023-07-07 DIAGNOSIS — R911 Solitary pulmonary nodule: Secondary | ICD-10-CM

## 2023-07-07 NOTE — Telephone Encounter (Signed)
Lung Screening from October 11th

## 2023-07-07 NOTE — Telephone Encounter (Signed)
Call report received:  IMPRESSION: 1. 8.2 mm right lower lobe nodule. Lung-RADS 4A, suspicious. Follow up low-dose chest CT without contrast in 3 months (please use the following order, "CT CHEST LCS NODULE FOLLOW-UP W/O CM") is recommended. Alternatively, PET may be considered when there is a solid component 8mm or larger. These results will be called to the ordering clinician or representative by the Radiologist Assistant, and communication documented in the PACS or [employer]. 2.  Aortic atherosclerosis (ICD10-I70.0). 3.  Emphysema (ICD10-J43.9).

## 2023-07-07 NOTE — Telephone Encounter (Signed)
I have called the patient with the results of his low-dose screening CT.  I explained that his scan was read as a lung RADS 4A, suspicious.  He has an 8.2 mm superior segment right lower lobe nodule.  This is the patient's baseline scan, we will do a 17-month follow-up to ensure stability and if there is continued growth we will order a PET scan at that point in time. Patient agrees with the  plan and verbalized understanding of the above and had no questions at completion of the call. He understands that we will call him closer to the time to get his 44-month follow-up scan scheduled.  This will be due at the beginning of January 2025.  Sherre Lain, and [place] please fax results to PCP and let them know plan is for 22-month follow-up scan. Please place order for 67-month follow-up Thank you so much

## 2023-07-09 NOTE — Telephone Encounter (Signed)
Results/plan faxed to PCP and order placed for 3 months follow nodule LDCT

## 2023-07-26 ENCOUNTER — Other Ambulatory Visit: Payer: Self-pay

## 2023-07-26 ENCOUNTER — Encounter (HOSPITAL_COMMUNITY): Payer: Self-pay | Admitting: Emergency Medicine

## 2023-07-26 ENCOUNTER — Emergency Department (HOSPITAL_COMMUNITY)
Admission: EM | Admit: 2023-07-26 | Discharge: 2023-07-26 | Disposition: A | Discharge status: Home / Self Care | Payer: No Typology Code available for payment source | Attending: Emergency Medicine | Admitting: Emergency Medicine

## 2023-07-26 DIAGNOSIS — F17200 Nicotine dependence, unspecified, uncomplicated: Secondary | ICD-10-CM | POA: Diagnosis not present

## 2023-07-26 DIAGNOSIS — M545 Low back pain, unspecified: Secondary | ICD-10-CM | POA: Diagnosis present

## 2023-07-26 DIAGNOSIS — I10 Essential (primary) hypertension: Secondary | ICD-10-CM | POA: Diagnosis not present

## 2023-07-26 DIAGNOSIS — M5442 Lumbago with sciatica, left side: Secondary | ICD-10-CM | POA: Insufficient documentation

## 2023-07-26 DIAGNOSIS — Z7982 Long term (current) use of aspirin: Secondary | ICD-10-CM | POA: Diagnosis not present

## 2023-07-26 DIAGNOSIS — Z79899 Other long term (current) drug therapy: Secondary | ICD-10-CM | POA: Diagnosis not present

## 2023-07-26 DIAGNOSIS — M5432 Sciatica, left side: Secondary | ICD-10-CM

## 2023-07-26 LAB — I-STAT CHEM 8, ED
BUN: 11 mg/dL (ref 8–23)
Calcium, Ion: 1.18 mmol/L (ref 1.15–1.40)
Chloride: 104 mmol/L (ref 98–111)
Creatinine, Ser: 1.1 mg/dL (ref 0.61–1.24)
Glucose, Bld: 115 mg/dL — ABNORMAL HIGH (ref 70–99)
HCT: 35 % — ABNORMAL LOW (ref 39.0–52.0)
Hemoglobin: 11.9 g/dL — ABNORMAL LOW (ref 13.0–17.0)
Potassium: 3.6 mmol/L (ref 3.5–5.1)
Sodium: 140 mmol/L (ref 135–145)
TCO2: 23 mmol/L (ref 22–32)

## 2023-07-26 MED ORDER — MELOXICAM 5 MG PO CAPS: 5.0000 mg | ORAL_CAPSULE | Freq: Two times a day (BID) | ORAL | 0 refills | Status: DC | PRN

## 2023-07-26 MED ORDER — METHOCARBAMOL 500 MG PO TABS: 500.0000 mg | ORAL_TABLET | Freq: Two times a day (BID) | ORAL | 0 refills | Status: DC | PRN

## 2023-07-26 MED ORDER — KETOROLAC TROMETHAMINE 60 MG/2ML IM SOLN
30.0000 mg | Freq: Once | INTRAMUSCULAR | Status: AC
  Administered 2023-07-26: 30 mg via INTRAMUSCULAR
  Filled 2023-07-26: qty 2

## 2023-07-26 NOTE — ED Triage Notes (Signed)
Pt reports lower back pain that began 2 days ago. Reports he wants a pain shot.

## 2023-07-26 NOTE — Discharge Instructions (Addendum)
Please take your prescribed medications as directed. Additionally you can take Acetaminophen (Tylenol), use topical muscle creams such as SalonPas, [employer], Bengay, etc. Please stretch, apply ice or heat (whichever helps), and have massage therapy for additional assistance.  For pain control you may take 1000 mg of acetaminophen (Tylenol) every 8 hours as needed. Do not take other NSAIDS such as ibuprofen or naproxen with the prescribed Meloxicam.

## 2023-07-26 NOTE — ED Provider Notes (Signed)
[place] EMERGENCY DEPARTMENT AT [facility] Provider Note  CSN: [id number] Arrival date & time: 07/26/23 0541  Chief Complaint(s) Back Pain  Reginald MATRIX SCHWIND Sr. is a 67 y.o. male with a past medical history listed below including chronic lower back pain here for exacerbation of the same.  He reports that over the past 2 days the pain has been more severe.  Endorses left-sided radiculopathy.  No bladder/bowel incontinence.  No lower extremity weakness or loss of sensation.  Pain worse with certain positions and movement.  Patient has tried muscle creams and massages.  Denies any fall or trauma.  No other physical complaints.  The history is provided by the patient.    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   Cocaine dependence, in remission (HCC) 09/26/2021   Cannabis dependence, in remission (HCC) 09/26/2021   Lumbosacral radiculitis 09/26/2021   Tobacco dependence 09/26/2021   Seasonal allergic rhinitis 09/26/2021   Aortic atherosclerosis (HCC) 09/26/2021   PTSD (post-traumatic stress disorder) 02/17/2018   HLD (hyperlipidemia) 02/17/2018   GERD (gastroesophageal reflux disease) 02/17/2018   Essential hypertension 02/17/2018   Left-sided trigeminal neuralgia 06/23/2017   Atypical facial pain 02/02/2017   History of colon polyps 09/14/2004   Home Medication(s) Prior  to Admission medications   Medication Sig Start Date End Date Taking? Authorizing Provider  Meloxicam 5 MG CAPS Take 5 mg by mouth 2 (two) times daily as needed. 07/26/23  Yes Lorren Rossetti, Amadeo Garnet, MD  methocarbamol (ROBAXIN) 500 MG tablet Take 1-2 tablets (500-1,000 mg total) by mouth 2 (two) times daily as needed for muscle spasms. 07/26/23  Yes Griffin Gerrard, Amadeo Garnet, MD  acetaminophen (TYLENOL) 500 MG tablet Take 1 tablet by mouth 3 (three) times daily as needed. 01/19/20   [provider]  aspirin EC 81 MG tablet TAKE TWO TABLETS BY MOUTH DAILY FOR HEART 05/05/22   [provider]  atorvastatin (LIPITOR) 10 MG tablet TAKE ONE TABLET BY MOUTH AT BEDTIME FOR CHOLESTEROL 05/05/22   [provider]  fluticasone (FLONASE) 50 MCG/ACT nasal spray INSTILL 1 SPRAY IN EACH NOSTRIL AT BEDTIME 01/19/22   [provider]  gabapentin (NEURONTIN) 300 MG capsule Take 1 capsule by mouth at bedtime. 04/15/21   [provider]  lidocaine (LIDODERM) 5 % APPLY 1 PATCH TO SKIN ONCE DAILY (APPLY FOR 12 HOURS, THEN REMOVE FOR 12 HOURS) 02/10/22   Renne Crigler, PA-C  lisinopril-hydrochlorothiazide (ZESTORETIC) 20-12.5 MG tablet Take 1 tablet by mouth 2 (two) times daily. 01/19/22   [provider]  loratadine (CLARITIN) 10 MG tablet Take 1 tablet by mouth daily. 07/22/21   [provider]  meclizine (ANTIVERT) 25 MG tablet Take 1 tablet (25 mg total) by mouth 3 (three) times daily as needed for dizziness. 06/04/22   Bethann Berkshire, MD  nortriptyline (PAMELOR) 75 MG capsule TAKE ONE CAPSULE BY MOUTH AT BEDTIME  AS NEEDED (DOSE INCREASE) 12/30/21   [provider]  omeprazole (PRILOSEC) 40 MG capsule TAKE ONE CAPSULE BY MOUTH DAILY (TAKE ON AN EMPTY STOMACH 30 MINUTES PRIOR TO A MEAL) 05/05/22   [provider]  simethicone (MYLICON) 80 MG chewable tablet CHEW TWO TABLETS BY MOUTH DAILY 07/21/22   [provider]  tamsulosin (FLOMAX) 0.4 MG CAPS  capsule Take 1 capsule by mouth at bedtime. 07/21/22   [provider]  traMADol (ULTRAM) 50 MG tablet Take 1 tablet (50 mg total) by mouth every 6 (six) hours as needed (pain). Patient not taking: Reported on 04/12/2023 12/03/22   Zenia Resides, MD  Oxcarbazepine (TRILEPTAL) 300 MG tablet Take 1 tablet (300 mg total) by mouth 2 (two) times daily. 04/12/17 09/04/19  Levert Feinstein, MD  pregabalin (LYRICA) 300 MG capsule Take 1 capsule (300 mg total) by mouth 3 (three) times daily. 04/12/17 08/01/19  Levert Feinstein, MD                                                                                                                                    Allergies Patient has no known allergies.  Review of Systems Review of Systems As noted in Reginald  Physical Exam Vital Signs  I have reviewed the triage vital signs BP (!) 171/111   Pulse 82   Temp 98.2 F (36.8 C) (Oral)   Resp 19   SpO2 98%   Physical Exam Vitals reviewed.  Constitutional:      General: He is not in acute distress.    Appearance: He is well-developed. He is not diaphoretic.  HENT:     Head: Normocephalic and atraumatic.     Right Ear: External ear normal.     Left Ear: External ear normal.     Nose: Nose normal.     Mouth/Throat:     Mouth: Mucous membranes are moist.  Eyes:     General: No scleral icterus.    Conjunctiva/sclera: Conjunctivae normal.  Neck:     Trachea: Phonation normal.  Cardiovascular:     Rate and Rhythm: Normal rate and regular rhythm.  Pulmonary:     Effort: Pulmonary effort is normal. No respiratory distress.     Breath sounds: No stridor.  Abdominal:     General: There is no distension.  Musculoskeletal:        General: Normal range of motion.     Cervical back: Normal range of motion.     Lumbar back: Tenderness present. No bony tenderness.     Comments: Spine Exam: Strength: 5/5 throughout LE bilaterally  Sensation: Intact to light touch in proximal and distal LE  bilaterally Reflexes:no clonus   Neurological:     Mental Status: He is alert and oriented to person, place, and time.  Psychiatric:        Behavior: Behavior normal.     ED Results and Treatments Labs (all  labs ordered are listed, but only abnormal results are displayed) Labs Reviewed  I-STAT CHEM 8, ED - Abnormal; Notable for the following components:      Result Value   Glucose, Bld 115 (*)    Hemoglobin 11.9 (*)    HCT 35.0 (*)    All other components within normal limits                                                                                                                         EKG  EKG Interpretation Date/Time:    Ventricular Rate:    PR Interval:    QRS Duration:    QT Interval:    QTC Calculation:   R Axis:      Text Interpretation:         Radiology No results found.  Medications Ordered in ED Medications  ketorolac (TORADOL) injection 30 mg (30 mg Intramuscular Given 07/26/23 0640)   Procedures Procedures  (including critical care time) Medical Decision Making / ED Course   Medical Decision Making Amount and/or Complexity of Data Reviewed Labs: ordered. Decision-making details documented in ED Course.  Risk Prescription drug management.    67 y.o. male presents with exacerbation of chronic/recurring lower back pain in lumbar area for with left radicular pain. No acute traumatic onset. No red flag symptoms of fever, weight loss, saddle anesthesia, weakness, fecal/urinary incontinence or urinary retention.   Suspect MSK etiology vs radiculopathy. No indication for imaging emergently.   Renal function checked and intact. IM Toradol given.  Patient was recommended to take short course of scheduled NSAIDs and engage in early mobility as definitive treatment. Return precautions discussed for worsening or new concerning symptoms.       Final Clinical Impression(s) / ED Diagnoses Final diagnoses:  Sciatica of left side   The patient  appears reasonably screened and/or stabilized for discharge and I doubt any other medical condition or other [facility] requiring further screening, evaluation, or treatment in the ED at this time. I have discussed the findings, Dx and Tx plan with the patient/family who expressed understanding and agree(s) with the plan. Discharge instructions discussed at length. The patient/family was given strict return precautions who verbalized understanding of the instructions. No further questions at time of discharge.  Disposition: Discharge  Condition: Good  ED Discharge Orders          Ordered    Meloxicam 5 MG CAPS  2 times daily PRN        07/26/23 0653    methocarbamol (ROBAXIN) 500 MG tablet  2 times daily PRN        07/26/23 1610            Follow Up: Jeani Sow, MD [address] [place] [address] [phone]  Call  to schedule an appointment for close follow up    This chart was dictated using voice recognition software.  Despite best efforts to proofread,  errors can occur which can  change the documentation meaning.    Nira Conn, MD 07/26/23 [phone]

## 2023-08-02 ENCOUNTER — Encounter: Payer: Medicare HMO | Admitting: Family Medicine

## 2023-08-02 ENCOUNTER — Ambulatory Visit: Payer: Medicare PPO | Admitting: Podiatry

## 2023-08-16 ENCOUNTER — Telehealth: Payer: Self-pay

## 2023-08-16 NOTE — Telephone Encounter (Signed)
Transition Care Management Follow-up Telephone Call Date of discharge and from where: Reginald Burke 11/1 How have you been since you were released from the hospital? Patient is doing well. Patient has not followed up with Providers  Any questions or concerns? No  Items Reviewed: Did the pt receive and understand the discharge instructions provided? Yes  Medications obtained and verified? No  Other? No  Any new allergies since your discharge? No  Dietary orders reviewed? No Do you have support at home? Yes     Follow up appointments reviewed:  PCP Hospital f/u appt confirmed? No  Scheduled to see  on  @ . Specialist Hospital f/u appt confirmed? No  Scheduled to see  on  @ . Are transportation arrangements needed? No  If their condition worsens, is the pt aware to call PCP or go to the Emergency Dept.? Yes Was the patient provided with contact information for the PCP's office or ED? Yes Was to pt encouraged to call back with questions or concerns? Yes

## 2023-09-21 ENCOUNTER — Ambulatory Visit (HOSPITAL_COMMUNITY)
Admission: RE | Admit: 2023-09-21 | Discharge: 2023-09-21 | Disposition: A | Discharge status: Home / Self Care | Payer: No Typology Code available for payment source | Source: Ambulatory Visit | Attending: Acute Care | Admitting: Acute Care

## 2023-09-21 DIAGNOSIS — R911 Solitary pulmonary nodule: Secondary | ICD-10-CM | POA: Insufficient documentation

## 2023-09-30 ENCOUNTER — Telehealth: Payer: Self-pay | Admitting: Acute Care

## 2023-09-30 DIAGNOSIS — R911 Solitary pulmonary nodule: Secondary | ICD-10-CM

## 2023-09-30 DIAGNOSIS — Z87891 Personal history of nicotine dependence: Secondary | ICD-10-CM

## 2023-09-30 NOTE — Telephone Encounter (Signed)
Left VM for patient to call for review of LDCT results LR3

## 2023-09-30 NOTE — Telephone Encounter (Signed)
CT from January 10th

## 2023-09-30 NOTE — Telephone Encounter (Signed)
Call report confirmed  IMPRESSION: Lung-RADS 3, probably benign findings. Short-term follow-up in 6 months is recommended with repeat low-dose chest CT without contrast (please use the following order, "CT CHEST LCS NODULE FOLLOW-UP W/O CM"). Superior segment right lower lobe 7.3 mm solid pulmonary nodule is not substantially changed.   Aortic Atherosclerosis (ICD10-I70.0) and Emphysema (ICD10-J43.9).

## 2023-10-01 NOTE — Telephone Encounter (Signed)
Letter mailed to home

## 2023-10-15 NOTE — Telephone Encounter (Signed)
Spoke with pt and reviewed CT results. Nodule see on last scan has decreased in size . We will repeat scan again in 6 months to make sure it continues to decrease in size. Pt verbalized understanding and is aware we will call him closer to 6 mth to schedule next scan.

## 2023-10-15 NOTE — Addendum Note (Signed)
Addended by: Abigail Miyamoto D on: 10/15/2023 11:21 AM   Modules accepted: Orders

## 2023-11-25 ENCOUNTER — Encounter: Payer: Self-pay | Admitting: Family Medicine

## 2023-11-25 ENCOUNTER — Ambulatory Visit (INDEPENDENT_AMBULATORY_CARE_PROVIDER_SITE_OTHER): Admitting: Family Medicine

## 2023-11-25 VITALS — BP 134/100 | HR 77 | Temp 97.4°F | Resp 18 | Ht 72.0 in | Wt 199.2 lb

## 2023-11-25 DIAGNOSIS — I10 Essential (primary) hypertension: Secondary | ICD-10-CM | POA: Diagnosis not present

## 2023-11-25 DIAGNOSIS — R1084 Generalized abdominal pain: Secondary | ICD-10-CM

## 2023-11-25 DIAGNOSIS — N401 Enlarged prostate with lower urinary tract symptoms: Secondary | ICD-10-CM

## 2023-11-25 DIAGNOSIS — R350 Frequency of micturition: Secondary | ICD-10-CM | POA: Diagnosis not present

## 2023-11-25 LAB — COMPREHENSIVE METABOLIC PANEL
ALT: 10 U/L (ref 0–53)
AST: 15 U/L (ref 0–37)
Albumin: 4.5 g/dL (ref 3.5–5.2)
Alkaline Phosphatase: 57 U/L (ref 39–117)
BUN: 10 mg/dL (ref 6–23)
CO2: 27 meq/L (ref 19–32)
Calcium: 9.6 mg/dL (ref 8.4–10.5)
Chloride: 104 meq/L (ref 96–112)
Creatinine, Ser: 1.15 mg/dL (ref 0.40–1.50)
GFR: 65.42 mL/min (ref >60.00)
Glucose, Bld: 110 mg/dL — ABNORMAL HIGH (ref 70–99)
Potassium: 3.7 meq/L (ref 3.5–5.1)
Sodium: 137 meq/L (ref 135–145)
Total Bilirubin: 1 mg/dL (ref 0.2–1.2)
Total Protein: 7.6 g/dL (ref 6.0–8.3)

## 2023-11-25 LAB — POC URINALSYSI DIPSTICK (AUTOMATED)
Bilirubin, UA: NEGATIVE
Blood, UA: NEGATIVE
Glucose, UA: NEGATIVE
Ketones, UA: NEGATIVE
Leukocytes, UA: NEGATIVE
Nitrite, UA: NEGATIVE
Protein, UA: NEGATIVE
Spec Grav, UA: 1.015 (ref 1.010–1.025)
Urobilinogen, UA: 0.2 U/dL
pH, UA: 6 (ref 5.0–8.0)

## 2023-11-25 LAB — CBC WITH DIFFERENTIAL/PLATELET
Basophils Absolute: 0 10*3/uL (ref 0.0–0.1)
Basophils Relative: 0.4 % (ref 0.0–3.0)
Eosinophils Absolute: 0.2 10*3/uL (ref 0.0–0.7)
Eosinophils Relative: 2.6 % (ref 0.0–5.0)
HCT: 42.2 % (ref 39.0–52.0)
Hemoglobin: 14.1 g/dL (ref 13.0–17.0)
Lymphocytes Relative: 46.1 % — ABNORMAL HIGH (ref 12.0–46.0)
Lymphs Abs: 2.9 10*3/uL (ref 0.7–4.0)
MCHC: 33.5 g/dL (ref 30.0–36.0)
MCV: 86.5 fl (ref 78.0–100.0)
Monocytes Absolute: 0.6 10*3/uL (ref 0.1–1.0)
Monocytes Relative: 8.9 % (ref 3.0–12.0)
Neutro Abs: 2.7 10*3/uL (ref 1.4–7.7)
Neutrophils Relative %: 42 % — ABNORMAL LOW (ref 43.0–77.0)
Platelets: 254 10*3/uL (ref 150.0–400.0)
RBC: 4.88 Mil/uL (ref 4.22–5.81)
RDW: 14.3 % (ref 11.5–15.5)
WBC: 6.3 10*3/uL (ref 4.0–10.5)

## 2023-11-25 MED ORDER — AMLODIPINE BESYLATE 5 MG PO TABS: 5.0000 mg | ORAL_TABLET | Freq: Every day | ORAL | 0 refills | Status: DC

## 2023-11-25 NOTE — Progress Notes (Signed)
 Labs ok and urine negative.  If pain returns, let us know.

## 2023-11-25 NOTE — Progress Notes (Signed)
 Subjective:     Patient ID: Reginald Cos Sr., male    DOB: March 02, 1956, 68 y.o.   MRN: [id number]  Chief Complaint  Patient presents with   Abdominal Pain    Lower abdominal pain that started 2 weeks ago    Hypertension    Blood pressures have been running high    HPI-has not been seen her since 07/2022-goes to [place] in [place](reviewed notes) 160/100-for 1 wk Discussed the use of AI scribe software for clinical note transcription with the patient, who gave verbal consent to proceed.  History of Present Illness   The patient is a 68 year old with hypertension who presents with elevated blood pressure.  He has experienced elevated blood pressure, with readings around 160/100 mmHg, for about a week. No chest pain or shortness of breath, but he has severe headaches. His blood pressure was stable prior to this episode. on lisinopril hct 20/12.5mg  bid.    Approximately a week ago, he experienced bladder pain, described as constant for one to two weeks before resolving about a week ago. During this time, he noticed an increase in blood pressure. He reports frequent urination, particularly at night, and had stopped taking his bladder medication, Flomax, which he later resumed. No burning sensation during urination.  He is currently taking lisinopril HCT 20/12.5 mg twice daily for hypertension and confirms adherence to this regimen, except for missing a dose this morning due to not having eaten. He denies the use of cocaine or methamphetamines.  He continues to smoke cigarettes.  gets his care from the [place]       Health Maintenance Due  Topic Date Due   Hepatitis C Screening  Never done    Past Medical History:  Diagnosis Date   Anxiety and depression    GERD (gastroesophageal reflux disease)    Hypercholesteremia    Hypertension    Left facial pain    Migraine    PTSD (post-traumatic stress disorder)     History reviewed. No pertinent surgical history.   Current Outpatient  Medications:    acetaminophen (TYLENOL) 500 MG tablet, Take 1 tablet by mouth 3 (three) times daily as needed., Disp: , Rfl:    amLODipine (NORVASC) 5 MG tablet, Take 1 tablet (5 mg total) by mouth daily., Disp: 30 tablet, Rfl: 0   aspirin EC 81 MG tablet, TAKE TWO TABLETS BY MOUTH DAILY FOR HEART, Disp: , Rfl:    atorvastatin (LIPITOR) 10 MG tablet, TAKE ONE TABLET BY MOUTH AT BEDTIME FOR CHOLESTEROL, Disp: , Rfl:    cyclobenzaprine (FLEXERIL) 10 MG tablet, Take 10 mg by mouth 3 (three) times daily as needed for muscle spasms., Disp: , Rfl:    fluticasone (FLONASE) 50 MCG/ACT nasal spray, INSTILL 1 SPRAY IN EACH NOSTRIL AT BEDTIME, Disp: , Rfl:    gabapentin (NEURONTIN) 300 MG capsule, Take 1 capsule by mouth at bedtime., Disp: , Rfl:    lidocaine (LIDODERM) 5 %, APPLY 1 PATCH TO SKIN ONCE DAILY (APPLY FOR 12 HOURS, THEN REMOVE FOR 12 HOURS), Disp: 30 patch, Rfl: 0   lisinopril-hydrochlorothiazide (ZESTORETIC) 20-12.5 MG tablet, Take 1 tablet by mouth 2 (two) times daily., Disp: , Rfl:    loratadine (CLARITIN) 10 MG tablet, Take 1 tablet by mouth daily., Disp: , Rfl:    meclizine (ANTIVERT) 25 MG tablet, Take 1 tablet (25 mg total) by mouth 3 (three) times daily as needed for dizziness., Disp: 20 tablet, Rfl: 0   methocarbamol (ROBAXIN) 750 MG tablet,  Take by mouth., Disp: , Rfl:    nortriptyline (PAMELOR) 75 MG capsule, TAKE ONE CAPSULE BY MOUTH AT BEDTIME AS NEEDED (DOSE INCREASE), Disp: , Rfl:    omeprazole (PRILOSEC) 40 MG capsule, Take by mouth., Disp: , Rfl:    simethicone (MYLICON) 80 MG chewable tablet, CHEW TWO TABLETS BY MOUTH DAILY, Disp: , Rfl:    tamsulosin (FLOMAX) 0.4 MG CAPS capsule, Take 1 capsule by mouth at bedtime., Disp: , Rfl:    urea (CARMOL) 20 % cream, Apply topically., Disp: , Rfl:   No Known Allergies ROS neg/noncontributory except as noted HPI/below      Objective:     BP (!) 134/100 (BP Location: Left Arm, Cuff Size: Normal)   Pulse 77   Temp (!) 97.4 F  (36.3 C) (Temporal)   Resp 18   Ht 6' (1.829 m)   Wt 199 lb 4 oz (90.4 kg)   SpO2 96%   BMI 27.02 kg/m  Wt Readings from Last 3 Encounters:  11/25/23 199 lb 4 oz (90.4 kg)  04/12/23 190 lb (86.2 kg)  03/17/23 190 lb (86.2 kg)    Physical Exam   Gen: WDWN NAD HEENT: NCAT, conjunctiva not injected, sclera nonicteric NECK:  supple, no thyromegaly, no nodes, no carotid bruits CARDIAC: RRR, S1S2+, no murmur. DP 1+B LUNGS: CTAB. No wheezes ABDOMEN:  BS+, soft, diffusely tender-mild No HSM, no masses EXT:  no edema MSK: no gross abnormalities.  NEURO: A&O x3.  CN II-XII intact.  PSYCH: normal mood. Good eye contact UA neg    Assessment & Plan:  Essential hypertension  Generalized abdominal pain -     CBC with Differential/Platelet -     Comprehensive metabolic panel -     POCT Urinalysis Dipstick (Automated)  Benign prostatic hyperplasia with urinary frequency  Other orders -     amLODIPine Besylate; Take 1 tablet (5 mg total) by mouth daily.  Dispense: 30 tablet; Refill: 0  Assessment and Plan    Hypertension   Blood pressure has recently increased to 160/100 mmHg, despite being previously well-controlled on lisinopril HCT 20/12.5 mg twice daily.. The increase may be related to recent bladder pain and possible prostatitis. There are no changes in medication adherence or lifestyle factors, and the current medication is at maximum dosage. Add amlodipine 5 mg daily to the antihypertensive regimen. Monitor blood pressure at home and report significant changes. Follow up in two weeks for blood pressure evaluation.  Bladder Pain   Bladder pain, lasting one to two weeks, resolved after resuming Flomax, which was previously discontinued by pt. The pain was associated with increased urinary frequency, possibly due to urinary retention. Current symptoms include frequent urination without dysuria, suggesting possible prostatitis or urinary tract infection. Perform urinalysis to check  for infection or abnormalities and order blood work to assess white blood cell count and other infection markers. Continue Flomax as prescribed.  Prostatitis (suspected)   Suspected prostatitis is indicated by frequent urination and bladder pain, with no dysuria reported. Physical exam reveals tenderness in the lower abdomen, suggesting possible infection or inflammation of the prostate. Perform urinalysis to check for signs of infection and order blood work to assess for infection markers. Consider antibiotic treatment if urinalysis indicates infection.  UA negative  General Health Maintenance   He is a smoker, which increases the risk for cardiovascular disease and hypertension. Advise smoking cessation and provide resources for quitting. Inform about the [facility] walk-in clinic for non-emergency issues.  Bph-controlled when on flomax-has restarted  Return in about 2 weeks (around 12/09/2023) for HTN.  [name], MD

## 2023-11-25 NOTE — Patient Instructions (Signed)
 Adding amlodipine 5mg  daily for blood pressure.  Monitor blood pressures

## 2023-12-09 ENCOUNTER — Ambulatory Visit (INDEPENDENT_AMBULATORY_CARE_PROVIDER_SITE_OTHER): Admitting: Family Medicine

## 2023-12-09 ENCOUNTER — Encounter: Payer: Self-pay | Admitting: Family Medicine

## 2023-12-09 VITALS — BP 126/70 | HR 100 | Temp 97.0°F | Resp 16 | Ht 72.0 in | Wt 199.0 lb

## 2023-12-09 DIAGNOSIS — I10 Essential (primary) hypertension: Secondary | ICD-10-CM

## 2023-12-09 MED ORDER — AMLODIPINE BESYLATE 5 MG PO TABS: 5.0000 mg | ORAL_TABLET | Freq: Every day | ORAL | 1 refills | Status: DC

## 2023-12-09 NOTE — Patient Instructions (Signed)

## 2023-12-09 NOTE — Progress Notes (Signed)
 Subjective:     Patient ID: Reginald Cos Sr., male    DOB: 11/06/1955, 68 y.o.   MRN: [id number]  Chief Complaint  Patient presents with   Hypertension    2 week follow-up on htn     HPI Discussed the use of AI scribe software for clinical note transcription with the patient, who gave verbal consent to proceed.  History of Present Illness Reginald Burke. is a 68 year old male with hypertension who presents for a follow-up visit.  He is currently taking lisinopril HCT 20/12.5 mg twice a day and amlodipine 5 mg daily for hypertension. His blood pressure readings at home are approximately 110/70 mmHg, indicating good control. No headaches, dizziness, chest pain, shortness of breath, or swelling in the legs. He feels good overall and notes that the medication has significantly helped in controlling his blood pressure.  Going to 55yr class reunion in June in [employer] [place]   Health Maintenance Due  Topic Date Due   Hepatitis C Screening  Never done    Past Medical History:  Diagnosis Date   Anxiety and depression    GERD (gastroesophageal reflux disease)    Hypercholesteremia    Hypertension    Left facial pain    Migraine    PTSD (post-traumatic stress disorder)     History reviewed. No pertinent surgical history.   Current Outpatient Medications:    acetaminophen (TYLENOL) 500 MG tablet, Take 1 tablet by mouth 3 (three) times daily as needed., Disp: , Rfl:    aspirin EC 81 MG tablet, TAKE TWO TABLETS BY MOUTH DAILY FOR HEART, Disp: , Rfl:    atorvastatin (LIPITOR) 10 MG tablet, TAKE ONE TABLET BY MOUTH AT BEDTIME FOR CHOLESTEROL, Disp: , Rfl:    cyclobenzaprine (FLEXERIL) 10 MG tablet, Take 10 mg by mouth 3 (three) times daily as needed for muscle spasms., Disp: , Rfl:    fluticasone (FLONASE) 50 MCG/ACT nasal spray, INSTILL 1 SPRAY IN EACH NOSTRIL AT BEDTIME, Disp: , Rfl:    gabapentin (NEURONTIN) 300 MG capsule, Take 1 capsule by mouth at bedtime., Disp: ,  Rfl:    lidocaine (LIDODERM) 5 %, APPLY 1 PATCH TO SKIN ONCE DAILY (APPLY FOR 12 HOURS, THEN REMOVE FOR 12 HOURS), Disp: 30 patch, Rfl: 0   lisinopril-hydrochlorothiazide (ZESTORETIC) 20-12.5 MG tablet, Take 1 tablet by mouth 2 (two) times daily., Disp: , Rfl:    loratadine (CLARITIN) 10 MG tablet, Take 1 tablet by mouth daily., Disp: , Rfl:    meclizine (ANTIVERT) 25 MG tablet, Take 1 tablet (25 mg total) by mouth 3 (three) times daily as needed for dizziness., Disp: 20 tablet, Rfl: 0   methocarbamol (ROBAXIN) 750 MG tablet, Take by mouth., Disp: , Rfl:    nortriptyline (PAMELOR) 75 MG capsule, TAKE ONE CAPSULE BY MOUTH AT BEDTIME AS NEEDED (DOSE INCREASE), Disp: , Rfl:    omeprazole (PRILOSEC) 40 MG capsule, Take by mouth., Disp: , Rfl:    simethicone (MYLICON) 80 MG chewable tablet, CHEW TWO TABLETS BY MOUTH DAILY, Disp: , Rfl:    tamsulosin (FLOMAX) 0.4 MG CAPS capsule, Take 1 capsule by mouth at bedtime., Disp: , Rfl:    urea (CARMOL) 20 % cream, Apply topically., Disp: , Rfl:    amLODipine (NORVASC) 5 MG tablet, Take 1 tablet (5 mg total) by mouth daily., Disp: 90 tablet, Rfl: 1  No Known Allergies ROS neg/noncontributory except as noted HPI/below      Objective:  BP 126/70   Pulse 100   Temp (!) 97 F (36.1 C) (Temporal)   Resp 16   Ht 6' (1.829 m)   Wt 199 lb (90.3 kg)   SpO2 99%   BMI 26.99 kg/m  Wt Readings from Last 3 Encounters:  12/09/23 199 lb (90.3 kg)  11/25/23 199 lb 4 oz (90.4 kg)  04/12/23 190 lb (86.2 kg)    Physical Exam   Gen: WDWN NAD HEENT: NCAT, conjunctiva not injected, sclera nonicteric NECK:  supple, no thyromegaly, no nodes, no carotid bruits CARDIAC: RRR, S1S2+, no murmur. DP 1+B LUNGS: CTAB. No wheezes EXT:  no edema MSK: no gross abnormalities.  NEURO: A&O x3.  CN II-XII intact.  PSYCH: normal mood. Good eye contact     Assessment & Plan:  Essential hypertension -     amLODIPine Besylate; Take 1 tablet (5 mg total) by mouth daily.   Dispense: 90 tablet; Refill: 1  Assessment and Plan Assessment & Plan  Hypertension   Hypertension is well-controlled with lisinopril HCT 20/12.5 mg twice daily and amlodipine 5 mg daily. Home blood pressure readings are approximately 110/70 mmHg, with no symptoms of hypotension. Monitoring is advised to ensure blood pressure does not drop too low. Continue current medications and monitor blood pressure regularly, at least a few times a week. Send a three-month supply of medications to [facility] on [address]. Schedule follow-up in three months, or sooner if blood pressure becomes too high or too low.    Return in about 3 months (around 03/10/2024) for HTN.  Reginald Sole, MD

## 2023-12-10 ENCOUNTER — Telehealth: Payer: Self-pay | Admitting: *Deleted

## 2023-12-10 ENCOUNTER — Other Ambulatory Visit: Payer: Self-pay | Admitting: Family Medicine

## 2023-12-10 DIAGNOSIS — I10 Essential (primary) hypertension: Secondary | ICD-10-CM

## 2023-12-10 MED ORDER — AMLODIPINE BESYLATE 5 MG PO TABS: 5.0000 mg | ORAL_TABLET | Freq: Every day | ORAL | 1 refills | Status: DC

## 2023-12-10 NOTE — Telephone Encounter (Signed)
 Copied from CRM [id number]. Topic: Clinical - Prescription Issue >> Dec 10, 2023  3:21 PM Melissa C wrote: Reason for CRM: patient went to the pharmacy to pick up his prescription and they said there was a problem with it. Patient calling to make sure everything is OK with his prescription and making sure it is sent to the pharmacy. He also requests that someone call him once it is sent so he knows when he is able to go back to the pharmacy to pick it up. Thank you  Rx sent to the pharmacy.

## 2023-12-21 DIAGNOSIS — H2513 Age-related nuclear cataract, bilateral: Secondary | ICD-10-CM | POA: Diagnosis not present

## 2024-02-22 ENCOUNTER — Encounter: Payer: Self-pay | Admitting: Family Medicine

## 2024-02-22 ENCOUNTER — Ambulatory Visit (INDEPENDENT_AMBULATORY_CARE_PROVIDER_SITE_OTHER): Admitting: Family Medicine

## 2024-02-22 VITALS — BP 120/86 | HR 89 | Temp 97.7°F | Resp 18 | Ht 72.0 in | Wt 205.2 lb

## 2024-02-22 DIAGNOSIS — I10 Essential (primary) hypertension: Secondary | ICD-10-CM

## 2024-02-22 DIAGNOSIS — E7800 Pure hypercholesterolemia, unspecified: Secondary | ICD-10-CM

## 2024-02-22 DIAGNOSIS — R7303 Prediabetes: Secondary | ICD-10-CM

## 2024-02-22 MED ORDER — AMLODIPINE BESYLATE 10 MG PO TABS: 10.0000 mg | ORAL_TABLET | Freq: Every day | ORAL | 1 refills | Status: DC

## 2024-02-22 NOTE — Progress Notes (Signed)
 Subjective:     Patient ID: Reginald Grout Sr., male    DOB: 18-Sep-1955, 68 y.o.   MRN: [id number]  Chief Complaint  Patient presents with   Medical Management of Chronic Issues    3 month follow-up on htn    HPI Discussed the use of AI scribe software for clinical note transcription with the patient, who gave verbal consent to proceed.  History of Present Illness Reginald Dayal. is a 68 year old male with hypertension who presents for blood pressure management and diabetes follow-up.  He takes amlodipine  5 mg daily and lisinopril HCT 20/12.5 mg twice a day for blood pressure management. Home blood pressure readings are generally around 120/90 mmHg, with diastolic pressure occasionally reaching the high 80s to low 90s. He experiences occasional headaches, which are not severe and occur sporadically, sometimes related to weather changes. No chest pain, shortness of breath, or leg swelling.  He has had a cough for about a week, which is intermittent and lasts about 15 minutes at a time. No gastrointestinal symptoms such as vomiting, diarrhea, or constipation.  He recalls being informed about elevated blood sugar levels in January 2023 and has since attempted to make dietary and exercise changes. He has not had recent blood work to assess his current blood sugar levels and denies symptoms such as blurry vision or increased urination.  He mentions that his last colonoscopy was a year or two ago at the [facility]. He recently had a lung scan for nodules.    Health Maintenance Due  Topic Date Due   Hepatitis C Screening  Never done   Colonoscopy  09/19/2014   Medicare Annual Wellness (AWV)  04/11/2024    Past Medical History:  Diagnosis Date   Anxiety and depression    GERD (gastroesophageal reflux disease)    Hypercholesteremia    Hypertension    Left facial pain    Migraine    PTSD (post-traumatic stress disorder)     History reviewed. No pertinent surgical  history.   Current Outpatient Medications:    acetaminophen  (TYLENOL ) 500 MG tablet, Take 1 tablet by mouth 3 (three) times daily as needed., Disp: , Rfl:    aspirin 81 MG chewable tablet, Chew 81 mg by mouth., Disp: , Rfl:    atorvastatin (LIPITOR) 10 MG tablet, TAKE ONE TABLET BY MOUTH AT BEDTIME FOR CHOLESTEROL, Disp: , Rfl:    cyclobenzaprine  (FLEXERIL ) 10 MG tablet, Take 10 mg by mouth 3 (three) times daily as needed for muscle spasms., Disp: , Rfl:    fluticasone (FLONASE) 50 MCG/ACT nasal spray, INSTILL 1 SPRAY IN EACH NOSTRIL AT BEDTIME, Disp: , Rfl:    gabapentin  (NEURONTIN ) 300 MG capsule, Take 1 capsule by mouth at bedtime., Disp: , Rfl:    lidocaine  (LIDODERM ) 5 %, APPLY 1 PATCH TO SKIN ONCE DAILY (APPLY FOR 12 HOURS, THEN REMOVE FOR 12 HOURS), Disp: 30 patch, Rfl: 0   lisinopril-hydrochlorothiazide (ZESTORETIC) 20-12.5 MG tablet, Take 1 tablet by mouth 2 (two) times daily., Disp: , Rfl:    loratadine (CLARITIN) 10 MG tablet, Take 1 tablet by mouth daily., Disp: , Rfl:    meclizine  (ANTIVERT ) 25 MG tablet, Take 1 tablet (25 mg total) by mouth 3 (three) times daily as needed for dizziness., Disp: 20 tablet, Rfl: 0   methocarbamol  (ROBAXIN ) 750 MG tablet, Take by mouth., Disp: , Rfl:    nortriptyline  (PAMELOR ) 75 MG capsule, TAKE ONE CAPSULE BY MOUTH AT BEDTIME AS NEEDED (DOSE INCREASE),  Disp: , Rfl:    omeprazole (PRILOSEC) 40 MG capsule, Take by mouth., Disp: , Rfl:    simethicone (MYLICON) 80 MG chewable tablet, CHEW TWO TABLETS BY MOUTH DAILY, Disp: , Rfl:    tamsulosin  (FLOMAX ) 0.4 MG CAPS capsule, Take 1 capsule by mouth at bedtime., Disp: , Rfl:    urea (CARMOL) 20 % cream, Apply topically., Disp: , Rfl:    amLODipine  (NORVASC ) 10 MG tablet, Take 1 tablet (10 mg total) by mouth daily., Disp: 90 tablet, Rfl: 1  No Known Allergies ROS neg/noncontributory except as noted HPI/below      Objective:      BP 120/86 (BP Location: Left Arm, Patient Position: Sitting, Cuff Size:  Large)   Pulse 89   Temp 97.7 F (36.5 C) (Temporal)   Resp 18   Ht 6' (1.829 m)   Wt 205 lb 4 oz (93.1 kg)   SpO2 98%   BMI 27.84 kg/m  Wt Readings from Last 3 Encounters:  02/22/24 205 lb 4 oz (93.1 kg)  12/09/23 199 lb (90.3 kg)  11/25/23 199 lb 4 oz (90.4 kg)    Physical Exam   Gen: WDWN NAD HEENT: NCAT, conjunctiva not injected, sclera nonicteric NECK:  supple, no thyromegaly, no nodes, no carotid bruits CARDIAC: RRR, S1S2+, no murmur. DP 2+B LUNGS: CTAB. No wheezes ABDOMEN:  BS+, soft, NTND, No HSM, no masses EXT:  no edema MSK: no gross abnormalities.  NEURO: A&O x3.  CN II-XII intact.  PSYCH: normal mood. Good eye contact     Assessment & Plan:  Essential hypertension -     amLODIPine  Besylate; Take 1 tablet (10 mg total) by mouth daily.  Dispense: 90 tablet; Refill: 1 -     CBC with Differential/Platelet -     Comprehensive metabolic panel with GFR  Pure hypercholesterolemia -     Comprehensive metabolic panel with GFR -     Lipid panel  Prediabetes -     Comprehensive metabolic panel with GFR -     Hemoglobin A1c  Assessment and Plan Assessment & Plan Hypertension   Hypertension is not optimally controlled with current medication. Home blood pressure readings are around 120/90 mmHg, with diastolic pressures often in the high 80s to low 90s. He reports no symptoms such as chest pain, shortness of breath, or leg swelling. Headaches are infrequent and mild. Current regimen includes amlodipine  5 mg daily and lisinopril HCT 20/12.5 mg daily. To better control diastolic pressure, increase amlodipine  to 10 mg daily, using two 5 mg tablets until the new prescription is filled. Monitor blood pressure at home and report readings in 2-3 weeks. [facility]  Type 2 Diabetes Mellitus   Previous lab results from January 2023 indicated diabetic range blood sugars. He committed to lifestyle changes including diet and exercise. No recent lab  results are available from the [facility], and he reports no symptoms of hyperglycemia such as blurry vision or polyuria. Order blood work to assess current A1c and blood glucose levels. Had to remind pt of this dx  HLD-chronic.  Taking lipitor 10mg .  Will check labs.  Unsure if controlled    Return in about 3 months (around 05/24/2024) for HTN.  Ellsworth Haas, MD

## 2024-02-22 NOTE — Patient Instructions (Signed)
 Increase the amlodipine  to 10mg -so new prescription sent but you can take 2 of the 5's you already have.  Monitor bp's and let me know in 2-3 weeks what they are running

## 2024-02-23 ENCOUNTER — Ambulatory Visit: Payer: Self-pay | Admitting: Family Medicine

## 2024-02-23 LAB — CBC WITH DIFFERENTIAL/PLATELET
Basophils Absolute: 0 10*3/uL (ref 0.0–0.1)
Basophils Relative: 1 % (ref 0.0–3.0)
Eosinophils Absolute: 0.2 10*3/uL (ref 0.0–0.7)
Eosinophils Relative: 3.9 % (ref 0.0–5.0)
HCT: 38.1 % — ABNORMAL LOW (ref 39.0–52.0)
Hemoglobin: 12.8 g/dL — ABNORMAL LOW (ref 13.0–17.0)
Lymphocytes Relative: 58.4 % — ABNORMAL HIGH (ref 12.0–46.0)
Lymphs Abs: 2.8 10*3/uL (ref 0.7–4.0)
MCHC: 33.6 g/dL (ref 30.0–36.0)
MCV: 85.2 fl (ref 78.0–100.0)
Monocytes Absolute: 0.6 10*3/uL (ref 0.1–1.0)
Monocytes Relative: 11.9 % (ref 3.0–12.0)
Neutro Abs: 1.2 10*3/uL — ABNORMAL LOW (ref 1.4–7.7)
Neutrophils Relative %: 24.8 % — ABNORMAL LOW (ref 43.0–77.0)
Platelets: 257 10*3/uL (ref 150.0–400.0)
RBC: 4.47 Mil/uL (ref 4.22–5.81)
RDW: 13.9 % (ref 11.5–15.5)
WBC: 4.9 10*3/uL (ref 4.0–10.5)

## 2024-02-23 LAB — LIPID PANEL
Cholesterol: 150 mg/dL (ref 0–200)
HDL: 36.5 mg/dL — ABNORMAL LOW (ref >39.00)
LDL Cholesterol: 54 mg/dL (ref 0–99)
NonHDL: 113.54
Total CHOL/HDL Ratio: 4
Triglycerides: 298 mg/dL — ABNORMAL HIGH (ref 0.0–149.0)
VLDL: 59.6 mg/dL — ABNORMAL HIGH (ref 0.0–40.0)

## 2024-02-23 LAB — COMPREHENSIVE METABOLIC PANEL WITH GFR
ALT: 11 U/L (ref 0–53)
AST: 14 U/L (ref 0–37)
Albumin: 4.2 g/dL (ref 3.5–5.2)
Alkaline Phosphatase: 61 U/L (ref 39–117)
BUN: 10 mg/dL (ref 6–23)
CO2: 29 meq/L (ref 19–32)
Calcium: 9.2 mg/dL (ref 8.4–10.5)
Chloride: 104 meq/L (ref 96–112)
Creatinine, Ser: 1.23 mg/dL (ref 0.40–1.50)
GFR: 60.25 mL/min
Glucose, Bld: 90 mg/dL (ref 70–99)
Potassium: 3.9 meq/L (ref 3.5–5.1)
Sodium: 139 meq/L (ref 135–145)
Total Bilirubin: 0.7 mg/dL (ref 0.2–1.2)
Total Protein: 7.2 g/dL (ref 6.0–8.3)

## 2024-02-23 LAB — HEMOGLOBIN A1C: Hgb A1c MFr Bld: 6.6 % — ABNORMAL HIGH (ref 4.6–6.5)

## 2024-02-23 NOTE — Progress Notes (Signed)
 1.  He does have diabetes.  At goal.  Needs to work on diet/exercise 2.  He has some anemia-does he donate blood?  Any bleeding or dark stools?  He had anemia 7 months ago(ER visit).  Has this ever been mentioned before?   If not, repeat cbcd,b12 and iron studies in 2 wks to make sure not dropping rapidly

## 2024-02-24 ENCOUNTER — Other Ambulatory Visit: Payer: Self-pay | Admitting: *Deleted

## 2024-02-24 DIAGNOSIS — D649 Anemia, unspecified: Secondary | ICD-10-CM

## 2024-03-07 ENCOUNTER — Telehealth: Payer: Self-pay | Admitting: *Deleted

## 2024-03-07 NOTE — Telephone Encounter (Signed)
 Patient stated that he called to give blood pressure numbers from 02/22/24-03/07/24  6/10 at 10  am:132/86 6/11 at 10:30 am: 118/80 6/12 at 11 am: 127/87 13th at 11:30 am: 113/82 15th at 1:30 pm: 110/80 16th at 11 am: 111/80 20th at 7 am: 122/86 21st at 11 am: 117/75 22nd at 6 am: 115/74 23rd at 8 am: 122/88, checked again and it was 110/78 24th at 8 am: 126/122, not completely sure about bottom number, checked again and it was 95/91

## 2024-03-08 NOTE — Telephone Encounter (Signed)
 Patient notified and verbalized understanding.

## 2024-03-08 NOTE — Telephone Encounter (Signed)
 Tried calling patient, unable to leave a message, mailbox full.

## 2024-03-09 ENCOUNTER — Other Ambulatory Visit (INDEPENDENT_AMBULATORY_CARE_PROVIDER_SITE_OTHER)

## 2024-03-09 DIAGNOSIS — D649 Anemia, unspecified: Secondary | ICD-10-CM

## 2024-03-10 ENCOUNTER — Other Ambulatory Visit

## 2024-03-10 LAB — CBC WITH DIFFERENTIAL/PLATELET
Basophils Absolute: 0.1 10*3/uL (ref 0.0–0.1)
Basophils Relative: 0.8 % (ref 0.0–3.0)
Eosinophils Absolute: 0.2 10*3/uL (ref 0.0–0.7)
Eosinophils Relative: 2.5 % (ref 0.0–5.0)
HCT: 40 % (ref 39.0–52.0)
Hemoglobin: 13.3 g/dL (ref 13.0–17.0)
Lymphocytes Relative: 41.4 % (ref 12.0–46.0)
Lymphs Abs: 3.3 10*3/uL (ref 0.7–4.0)
MCHC: 33.1 g/dL (ref 30.0–36.0)
MCV: 86.5 fl (ref 78.0–100.0)
Monocytes Absolute: 0.6 10*3/uL (ref 0.1–1.0)
Monocytes Relative: 7.8 % (ref 3.0–12.0)
Neutro Abs: 3.8 10*3/uL (ref 1.4–7.7)
Neutrophils Relative %: 47.5 % (ref 43.0–77.0)
Platelets: 287 10*3/uL (ref 150.0–400.0)
RBC: 4.63 Mil/uL (ref 4.22–5.81)
RDW: 13.9 % (ref 11.5–15.5)
WBC: 8 10*3/uL (ref 4.0–10.5)

## 2024-03-10 LAB — IRON,TIBC AND FERRITIN PANEL
%SAT: 19 % — ABNORMAL LOW (ref 20–48)
Ferritin: 37 ng/mL (ref 24–380)
Iron: 75 ug/dL (ref 50–180)
TIBC: 399 ug/dL (ref 250–425)

## 2024-03-10 LAB — VITAMIN B12: Vitamin B-12: 272 pg/mL (ref 211–911)

## 2024-03-12 ENCOUNTER — Ambulatory Visit: Payer: Self-pay | Admitting: Family Medicine

## 2024-03-12 NOTE — Progress Notes (Signed)
 Anemia some better.  Iron stores low end of normal and B12.   Take B12 otc 500-1078mcg/day Also, take iron otc twice/week

## 2024-03-15 ENCOUNTER — Encounter: Payer: Self-pay | Admitting: Family Medicine

## 2024-03-15 ENCOUNTER — Ambulatory Visit (INDEPENDENT_AMBULATORY_CARE_PROVIDER_SITE_OTHER): Admitting: Family Medicine

## 2024-03-15 VITALS — BP 119/84 | HR 86 | Temp 97.2°F | Resp 18 | Ht 72.0 in | Wt 206.5 lb

## 2024-03-15 DIAGNOSIS — G8929 Other chronic pain: Secondary | ICD-10-CM | POA: Diagnosis not present

## 2024-03-15 DIAGNOSIS — R739 Hyperglycemia, unspecified: Secondary | ICD-10-CM

## 2024-03-15 DIAGNOSIS — D649 Anemia, unspecified: Secondary | ICD-10-CM | POA: Diagnosis not present

## 2024-03-15 DIAGNOSIS — F5101 Primary insomnia: Secondary | ICD-10-CM | POA: Insufficient documentation

## 2024-03-15 DIAGNOSIS — M545 Low back pain, unspecified: Secondary | ICD-10-CM | POA: Diagnosis not present

## 2024-03-15 DIAGNOSIS — M79605 Pain in left leg: Secondary | ICD-10-CM

## 2024-03-15 DIAGNOSIS — M79604 Pain in right leg: Secondary | ICD-10-CM | POA: Diagnosis not present

## 2024-03-15 NOTE — Progress Notes (Signed)
 Subjective:     Patient ID: Reginald LITTIE Seip Sr., male    DOB: 1955-11-27, 68 y.o.   MRN: [id number]  Chief Complaint  Patient presents with   Medical Management of Chronic Issues    3 month follow-up on htn Bilateral muscle pain in back of thighs Not fasting     HPI Discussed the use of AI scribe software for clinical note transcription with the patient, who gave verbal consent to proceed.  History of Present Illness Reginald Burke. is a 68 year old male with diabetes who presents for a follow-up regarding his blood sugar levels and recent thigh pain.  He has been experiencing soreness in both thighs, which began a couple of days ago after returning from a trip to [place] . The pain is described as a constant ache, similar to the feeling after running four miles. There is no swelling in the thighs. He has a history of back pain, which he describes as 'a bad back'. No calf pain, no swelling  He reports difficulty sleeping, particularly with falling asleep, and waking up around 3 or 4 AM to use the bathroom, after which he finds it hard to fall back asleep. He is currently taking nortriptyline  75 mg for sleep, which helps him sleep but leaves him feeling groggy in the morning so not taking regularly-gets from [facility].  He is here for a follow-up on his blood sugar levels. His recent lab work showed an A1c of 6.6, indicating diabetes. He wants to manage his diet, particularly reducing sugar and starch intake. His wife has diabetes and is on insulin, and he wishes to avoid similar treatment.  He is taking Tylenol  500 mg for general pain relief, including headaches and muscle pain. He also takes atorvastatin for cholesterol management.  He drinks water throughout the day and will start taking B12 and iron supplements due to low levels identified in recent lab work. He is aware that iron supplements may cause constipation and is considering the use of a stool softener if needed.    Health  Maintenance Due  Topic Date Due   Hepatitis C Screening  Never done   Colonoscopy  09/19/2014   Medicare Annual Wellness (AWV)  04/11/2024    Past Medical History:  Diagnosis Date   Anxiety and depression    GERD (gastroesophageal reflux disease)    Hypercholesteremia    Hypertension    Left facial pain    Migraine    PTSD (post-traumatic stress disorder)     History reviewed. No pertinent surgical history.   Current Outpatient Medications:    acetaminophen  (TYLENOL ) 500 MG tablet, Take 1 tablet by mouth 3 (three) times daily as needed., Disp: , Rfl:    amLODipine  (NORVASC ) 10 MG tablet, Take 1 tablet (10 mg total) by mouth daily., Disp: 90 tablet, Rfl: 1   aspirin 81 MG chewable tablet, Chew 81 mg by mouth., Disp: , Rfl:    atorvastatin (LIPITOR) 10 MG tablet, TAKE ONE TABLET BY MOUTH AT BEDTIME FOR CHOLESTEROL, Disp: , Rfl:    cyclobenzaprine  (FLEXERIL ) 10 MG tablet, Take 10 mg by mouth 3 (three) times daily as needed for muscle spasms., Disp: , Rfl:    fluticasone (FLONASE) 50 MCG/ACT nasal spray, INSTILL 1 SPRAY IN EACH NOSTRIL AT BEDTIME, Disp: , Rfl:    gabapentin  (NEURONTIN ) 300 MG capsule, Take 1 capsule by mouth at bedtime., Disp: , Rfl:    lidocaine  (LIDODERM ) 5 %, APPLY 1 PATCH TO SKIN ONCE DAILY (  APPLY FOR 12 HOURS, THEN REMOVE FOR 12 HOURS), Disp: 30 patch, Rfl: 0   lisinopril-hydrochlorothiazide (ZESTORETIC) 20-12.5 MG tablet, Take 1 tablet by mouth 2 (two) times daily., Disp: , Rfl:    loratadine (CLARITIN) 10 MG tablet, Take 1 tablet by mouth daily., Disp: , Rfl:    meclizine  (ANTIVERT ) 25 MG tablet, Take 1 tablet (25 mg total) by mouth 3 (three) times daily as needed for dizziness., Disp: 20 tablet, Rfl: 0   methocarbamol  (ROBAXIN ) 750 MG tablet, Take by mouth., Disp: , Rfl:    nortriptyline  (PAMELOR ) 75 MG capsule, TAKE ONE CAPSULE BY MOUTH AT BEDTIME AS NEEDED (DOSE INCREASE), Disp: , Rfl:    omeprazole (PRILOSEC) 40 MG capsule, Take by mouth., Disp: , Rfl:     simethicone (MYLICON) 80 MG chewable tablet, CHEW TWO TABLETS BY MOUTH DAILY, Disp: , Rfl:    tamsulosin  (FLOMAX ) 0.4 MG CAPS capsule, Take 1 capsule by mouth at bedtime., Disp: , Rfl:    urea (CARMOL) 20 % cream, Apply topically., Disp: , Rfl:   No Known Allergies ROS neg/noncontributory except as noted HPI/below      Objective:     BP 119/84   Pulse 86   Temp (!) 97.2 F (36.2 C) (Temporal)   Resp 18   Ht 6' (1.829 m)   Wt 206 lb 8 oz (93.7 kg)   SpO2 98%   BMI 28.01 kg/m  Wt Readings from Last 3 Encounters:  03/15/24 206 lb 8 oz (93.7 kg)  02/22/24 205 lb 4 oz (93.1 kg)  12/09/23 199 lb (90.3 kg)    Physical Exam   Gen: WDWN NAD HEENT: NCAT, conjunctiva not injected, sclera nonicteric NECK:  supple, no thyromegaly, no nodes, no carotid bruits CARDIAC: RRR, S1S2+, no murmur. LUNGS: CTAB. No wheezes EXT:  no edema, no calf tenderness.  Some TTP B thighs MSK: no gross abnormalities.  NEURO: A&O x3.  CN II-XII intact.  PSYCH: normal mood. Good eye contact     Assessment & Plan:  Hyperglycemia  Chronic midline low back pain, unspecified whether sciatica present  Pain in both lower extremities  Anemia, unspecified type  Primary insomnia  Assessment and Plan Assessment & Plan Thigh Pain   Bilateral thigh pain is likely due to prolonged sitting during a long drive, resembling muscle soreness and possibly related to exacerbation of chronic back pain. Atorvastatin is being held for 3 days to rule out medication-related myalgia. He should engage in gentle stretching exercises, stay hydrated with electrolyte water, and take magnesium glycinate 500 mg at night for muscle cramps and as a sleep aid.  Chronic Back Pain   Chronic back pain may have been exacerbated by prolonged sitting during a recent trip-take tylenol .  Insomnia   He experiences difficulty with sleep initiation and maintenance. Nortriptyline  75 mg causes morning grogginess and difficulty waking. Plans  to discuss medication side effects with his [facility] provider and consider alternative sleep aids.  Type 2 Diabetes Mellitus   A1c is 6.6%, indicating controlled diabetes but DM.  Emphasis is on lifestyle modifications to prevent progression. He is motivated to avoid insulin therapy like his wife. Dietary changes to reduce sugar and starch intake were discussed, along with the potential impact of artificial sweeteners on sugar cravings. He should follow dietary modifications, engage in regular exercise, and consult a [facility] nutritionist for further dietary counseling(we did discuss dietary changes today).  Hypertriglyceridemia   Elevated triglycerides are likely related to dietary habits and diabetes. Dietary changes are  advised to reduce triglyceride levels.  Iron Deficiency Anemia   Slightly low iron stores were noted in lab results. He should take iron supplements twice a week, with consideration of a stool softener if constipation occurs.  Vitamin B12 Deficiency   Vitamin B12 deficiency is noted. He should take B12 supplements daily.  Hypertension   Blood pressure is well-controlled.  Follow-up   He plans to visit his [facility] provider on Monday to discuss sleep medication issues.    Return for as sch in sept.  Jenkins CHRISTELLA Carrel, MD

## 2024-03-15 NOTE — Patient Instructions (Addendum)
 Tell [facility] that the nortriptylline-makes you feel hung over.  Need a new medicine.   Cut back on sugars and starches.for the diabetes.   Ask [facility] for nutritionist, etc.  Hold the atorvastatin(cholesterol meds) for 3 days then get back on  Electrolyte water-one/day  Magnesium glycinate 500mg  at night  Pick up B12 and iron

## 2024-03-27 ENCOUNTER — Telehealth: Payer: Self-pay | Admitting: Acute Care

## 2024-03-27 NOTE — Telephone Encounter (Signed)
 Called patient, VM box is full. Patient needs follow up LDCT.

## 2024-03-28 NOTE — Telephone Encounter (Signed)
 Patient returned call. He states he goes to the [facility] and they have scheduled his follow up CT scan for 05/16/24 and he prefers to have it done through them due to costs.  Will make a note to look for report in September.  He states he understands the importance of the follow up.

## 2024-04-04 ENCOUNTER — Other Ambulatory Visit (HOSPITAL_COMMUNITY): Payer: Self-pay

## 2024-04-04 ENCOUNTER — Ambulatory Visit: Admitting: Family Medicine

## 2024-04-04 ENCOUNTER — Encounter: Payer: Self-pay | Admitting: Family Medicine

## 2024-04-04 ENCOUNTER — Telehealth: Payer: Self-pay

## 2024-04-04 VITALS — BP 128/82 | HR 91 | Temp 97.5°F | Ht 72.0 in | Wt 204.0 lb

## 2024-04-04 DIAGNOSIS — I10 Essential (primary) hypertension: Secondary | ICD-10-CM

## 2024-04-04 DIAGNOSIS — R21 Rash and other nonspecific skin eruption: Secondary | ICD-10-CM

## 2024-04-04 DIAGNOSIS — R350 Frequency of micturition: Secondary | ICD-10-CM | POA: Diagnosis not present

## 2024-04-04 DIAGNOSIS — Z1211 Encounter for screening for malignant neoplasm of colon: Secondary | ICD-10-CM

## 2024-04-04 DIAGNOSIS — B356 Tinea cruris: Secondary | ICD-10-CM | POA: Diagnosis not present

## 2024-04-04 DIAGNOSIS — N401 Enlarged prostate with lower urinary tract symptoms: Secondary | ICD-10-CM

## 2024-04-04 MED ORDER — TADALAFIL 5 MG PO TABS: 5.0000 mg | ORAL_TABLET | Freq: Every day | ORAL | 11 refills | Status: DC

## 2024-04-04 MED ORDER — KETOCONAZOLE 2 % EX CREA
1.0000 | TOPICAL_CREAM | Freq: Two times a day (BID) | CUTANEOUS | 0 refills | Status: DC
Start: 2024-04-04 — End: 2024-07-13

## 2024-04-04 MED ORDER — TRIAMCINOLONE ACETONIDE 0.5 % EX OINT: 1.0000 | TOPICAL_OINTMENT | Freq: Two times a day (BID) | CUTANEOUS | 0 refills | Status: AC

## 2024-04-04 NOTE — Telephone Encounter (Signed)
 Noted

## 2024-04-04 NOTE — Progress Notes (Signed)
   Reginald Burke. is a 68 y.o. male who presents today for an office visit.  Assessment/Plan:  Rash /pruritus Secondary to dermatitis. Discussed with patient that it is very unlikely that his B12 or magnesium supplement is causing any of his symptoms however it would be reasonable for him to discontinue for a couple of weeks.  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  We will start topical triamcinolone .  Discussed emollients.  He will let us  know if not improving  Tinea cruris Rash in groin consistent with tinea cruris.  Doubt that this is related to his above rash or his magnesium or B12.  Will start topical ketoconazole .  He will let us  know if not improving.  Erectile dysfunction Patient reports several months to years of difficulty with maintaining and achieving erection.  Not currently having any urinary symptoms.  He would like to try Cialis .  He has never been on this in the past.  We did discuss potential treatment options as well as risk and benefits.  Does have known history of BPH as well.  It would be reasonable for us  to start low-dose Cialis  at this point.  Will send a prescription for 5 mg daily as needed.  We did discuss potential side effects.  He will follow-up with me or his PCP in a few weeks if not improving and we can adjust the dose as tolerated.  Essential Hypertension  Initially elevated however at goal on recheck.  He will continue amlodipine  10 mg daily and lisinopril-HCTZ 20-12.5 twice daily.     Subjective:  HPI:  See A/P for status of chronic conditions.  Patient is here  with rash. This started a month ago. He did start taking magnesium and B12 a few weeks ago and is concerned that this may be causing his rash.  Rash predominantly located on the left arm though he has also noticed increased itching in his groin area as well.  No specific treatments tried.  Area is very itchy.  He did try using antijock itch spray on his groin area  without any improvement.       Objective:  Physical Exam: BP 128/82   Pulse 91   Temp (!) 97.5 F (36.4 C) (Temporal)   Ht 6' (1.829 m)   Wt 204 lb (92.5 kg)   SpO2 99%   BMI 27.67 kg/m   Gen: No acute distress, resting comfortably CV: Regular rate and rhythm with no murmurs appreciated Pulm: Normal work of breathing, clear to auscultation bilaterally with no crackles, wheezes, or rhonchi Skin: Excoriated papular erythematous rash on left arm.  Xerosis cutis noted. GU: Normal male genitalia noted.  Hypopigmented erythematous rash in bilateral inguinal creases. Neuro: Grossly normal, moves all extremities Psych: Normal affect and thought content      Sharanya Templin M. Kennyth, MD 04/04/2024 12:11 PM

## 2024-04-04 NOTE — Telephone Encounter (Signed)
 Pharmacy Patient Advocate Encounter   Received notification from Onbase that prior authorization for Tadalafil  5MG  tablets is required/requested.   Insurance verification completed.   The patient is insured through [facility] .   Per test claim: PA required; PA submitted to above mentioned insurance via CoverMyMeds Key/confirmation #/EOC [id number] Status is pending

## 2024-04-04 NOTE — Patient Instructions (Signed)
 It was very nice to see you today!  Please stop the magnesium and B12 until your rash improves.  Start the triamcinolone  and ketoconazole .  I will also send a prescription for Cialis .  Let me know or let Dr. Wendolyn know if your symptoms do not improve.   Return if symptoms worsen or fail to improve.   Take care, Dr Kennyth  PLEASE NOTE:  If you had any lab tests, please let us  know if you have not heard back within a few days. You may see your results on mychart before we have a chance to review them but we will give you a call once they are reviewed by us .   If we ordered any referrals today, please let us  know if you have not heard from their office within the next week.   If you had any urgent prescriptions sent in today, please check with the pharmacy within an hour of our visit to make sure the prescription was transmitted appropriately.   Please try these tips to maintain a healthy lifestyle:  Eat at least 3 REAL meals and 1-2 snacks per day.  Aim for no more than 5 hours between eating.  If you eat breakfast, please do so within one hour of getting up.   Each meal should contain half fruits/vegetables, one quarter protein, and one quarter carbs (no bigger than a computer mouse)  Cut down on sweet beverages. This includes juice, soda, and sweet tea.   Drink at least 1 glass of water with each meal and aim for at least 8 glasses per day  Exercise at least 150 minutes every week.

## 2024-04-04 NOTE — Telephone Encounter (Signed)
 Pharmacy Patient Advocate Encounter  Received notification from [facility] that Prior Authorization for Tadalafil  5MG  tablets has been APPROVED from 01/05/24 to 10/03/2024   PA #/Case ID/Reference #: [id number]

## 2024-04-13 ENCOUNTER — Other Ambulatory Visit: Payer: Self-pay | Admitting: Family Medicine

## 2024-04-13 NOTE — Telephone Encounter (Signed)
 Copied from CRM [id number]. Topic: Clinical - Medication Refill >> Apr 13, 2024 12:24 PM Suzette B wrote: Medication: cyclobenzaprine  (FLEXERIL ) 10 MG tablet  Has the patient contacted their pharmacy? No The patient wasn't aware he needed to call the pharmacy  This is the patient's preferred pharmacy:  [id number] [place],  - [address] AT [facility] [address] [place] [address] Phone: [phone] Fax: [phone]  Is this the correct pharmacy for this prescription? Yes If no, delete pharmacy and type the correct one.   Has the prescription been filled recently? Yes  Is the patient out of the medication? Yes  Has the patient been seen for an appointment in the last year OR does the patient have an upcoming appointment? Yes  Can we respond through MyChart? No  Agent: Please be advised that Rx refills may take up to 3 business days. We ask that you follow-up with your pharmacy.

## 2024-05-24 ENCOUNTER — Ambulatory Visit: Admitting: Family Medicine

## 2024-05-30 ENCOUNTER — Ambulatory Visit: Admitting: Family Medicine

## 2024-05-30 ENCOUNTER — Encounter: Payer: Self-pay | Admitting: Family Medicine

## 2024-05-30 VITALS — BP 122/80 | HR 90 | Temp 97.5°F | Resp 18 | Ht 72.0 in | Wt 205.1 lb

## 2024-05-30 DIAGNOSIS — E782 Mixed hyperlipidemia: Secondary | ICD-10-CM | POA: Diagnosis not present

## 2024-05-30 DIAGNOSIS — E119 Type 2 diabetes mellitus without complications: Secondary | ICD-10-CM | POA: Diagnosis not present

## 2024-05-30 DIAGNOSIS — I10 Essential (primary) hypertension: Secondary | ICD-10-CM

## 2024-05-30 DIAGNOSIS — N521 Erectile dysfunction due to diseases classified elsewhere: Secondary | ICD-10-CM | POA: Diagnosis not present

## 2024-05-30 DIAGNOSIS — Z23 Encounter for immunization: Secondary | ICD-10-CM | POA: Diagnosis not present

## 2024-05-30 LAB — POCT GLYCOSYLATED HEMOGLOBIN (HGB A1C): Hemoglobin A1C: 6.4 % — AB (ref 4.0–5.6)

## 2024-05-30 MED ORDER — TADALAFIL 20 MG PO TABS: 10.0000 mg | ORAL_TABLET | ORAL | 3 refills | Status: DC | PRN

## 2024-05-30 NOTE — Progress Notes (Signed)
 Subjective:     Patient ID: Reginald LITTIE Seip Sr., male    DOB: 10/13/1955, 68 y.o.   MRN: [id number]  Chief Complaint  Patient presents with   Medical Management of Chronic Issues    3 month follow-up     HPI Discussed the use of AI scribe software for clinical note transcription with the patient, who gave verbal consent to proceed.  History of Present Illness Reginald Burke. is a 68 year old male with diabetes and hypertension who presents with recent episodes of double vision and follow-up on his chronic conditions.  He experienced a sudden onset of double vision while watching TV, which has since resolved. He no longer experiences double vision but has difficulty focusing without glasses. Saw doc and ophth at [facility] yesterday.  Has f/u 1 wk and scan ordered.  Sugar was 124 yesterday at [facility]  He has a history of diabetes and was recently given a glucose monitor. His blood sugar was 124 mg/dL yesterday. He is working on dietary changes, including reducing juice intake and limiting starches and sweets. He exercises three times a week.  He is currently taking amlodipine  10 mg once daily and lisinopril 20/12.5 mg twice daily for hypertension. His blood pressure readings at home are generally around 120-130/80-90 mmHg. He mentions stress related to a financial issue today and thinks that is why it is up.  No cp/palp/edema/cough/sob.  He has been taking tadalafil  daily for erectile dysfunction since July, but reports it is not effective. He has used up his supply.    Health Maintenance Due  Topic Date Due   FOOT EXAM  Never done   OPHTHALMOLOGY EXAM  Never done   Diabetic kidney evaluation - Urine ACR  Never done   Colonoscopy  09/19/2014   Medicare Annual Wellness (AWV)  04/11/2024    Past Medical History:  Diagnosis Date   Anxiety and depression    GERD (gastroesophageal reflux disease)    Hypercholesteremia    Hypertension    Left facial pain    Migraine    PTSD  (post-traumatic stress disorder)     History reviewed. No pertinent surgical history.   Current Outpatient Medications:    acetaminophen  (TYLENOL ) 500 MG tablet, Take 1 tablet by mouth 3 (three) times daily as needed., Disp: , Rfl:    amLODipine  (NORVASC ) 10 MG tablet, Take 1 tablet (10 mg total) by mouth daily., Disp: 90 tablet, Rfl: 1   aspirin 81 MG chewable tablet, Chew 81 mg by mouth., Disp: , Rfl:    atorvastatin (LIPITOR) 10 MG tablet, TAKE ONE TABLET BY MOUTH AT BEDTIME FOR CHOLESTEROL, Disp: , Rfl:    cyanocobalamin  (VITAMIN B12) 500 MCG tablet, Take 1,000 mcg by mouth daily., Disp: , Rfl:    cyclobenzaprine  (FLEXERIL ) 10 MG tablet, Take 10 mg by mouth 3 (three) times daily as needed for muscle spasms., Disp: , Rfl:    fluticasone (FLONASE) 50 MCG/ACT nasal spray, INSTILL 1 SPRAY IN EACH NOSTRIL AT BEDTIME, Disp: , Rfl:    gabapentin  (NEURONTIN ) 300 MG capsule, Take 1 capsule by mouth at bedtime., Disp: , Rfl:    ketoconazole  (NIZORAL ) 2 % cream, Apply 1 Application topically 2 (two) times daily., Disp: 60 g, Rfl: 0   lidocaine  (LIDODERM ) 5 %, APPLY 1 PATCH TO SKIN ONCE DAILY (APPLY FOR 12 HOURS, THEN REMOVE FOR 12 HOURS), Disp: 30 patch, Rfl: 0   lisinopril-hydrochlorothiazide (ZESTORETIC) 20-12.5 MG tablet, Take 1 tablet by mouth 2 (two) times  daily., Disp: , Rfl:    loratadine (CLARITIN) 10 MG tablet, Take 1 tablet by mouth daily., Disp: , Rfl:    meclizine  (ANTIVERT ) 25 MG tablet, Take 1 tablet (25 mg total) by mouth 3 (three) times daily as needed for dizziness., Disp: 20 tablet, Rfl: 0   methocarbamol  (ROBAXIN ) 750 MG tablet, Take by mouth., Disp: , Rfl:    nortriptyline  (PAMELOR ) 75 MG capsule, TAKE ONE CAPSULE BY MOUTH AT BEDTIME AS NEEDED (DOSE INCREASE), Disp: , Rfl:    omeprazole (PRILOSEC) 40 MG capsule, Take by mouth., Disp: , Rfl:    simethicone (MYLICON) 80 MG chewable tablet, CHEW TWO TABLETS BY MOUTH DAILY, Disp: , Rfl:    tadalafil  (CIALIS ) 20 MG tablet, Take 0.5-1  tablets (10-20 mg total) by mouth every other day as needed for erectile dysfunction., Disp: 10 tablet, Rfl: 3   tamsulosin  (FLOMAX ) 0.4 MG CAPS capsule, Take 1 capsule by mouth at bedtime., Disp: , Rfl:    triamcinolone  ointment (KENALOG ) 0.5 %, Apply 1 Application topically 2 (two) times daily., Disp: 30 g, Rfl: 0   urea (CARMOL) 20 % cream, Apply topically., Disp: , Rfl:   No Known Allergies ROS neg/noncontributory except as noted HPI/below      Objective:     BP 122/80 (BP Location: Left Arm, Patient Position: Sitting, Cuff Size: Large)   Pulse 90   Temp (!) 97.5 F (36.4 C) (Temporal)   Resp 18   Ht 6' (1.829 m)   Wt 205 lb 2 oz (93 kg)   SpO2 98%   BMI 27.82 kg/m  Wt Readings from Last 3 Encounters:  05/30/24 205 lb 2 oz (93 kg)  04/04/24 204 lb (92.5 kg)  03/15/24 206 lb 8 oz (93.7 kg)    Physical Exam   Gen: WDWN NAD HEENT: NCAT, conjunctiva not injected, sclera nonicteric NECK:  supple, no thyromegaly, no nodes, no carotid bruits CARDIAC: RRR, S1S2+, no murmur. DP 2+B LUNGS: CTAB. No wheezes ABDOMEN:  BS+, soft, NTND, No HSM, no masses EXT:  no edema MSK: no gross abnormalities.  NEURO: A&O x3.  CN II-XII intact.  PSYCH: normal mood. Good eye contact  Results for orders placed or performed in visit on 05/30/24  POCT HgB A1C   Collection Time: 05/30/24  2:34 PM  Result Value Ref Range   Hemoglobin A1C 6.4 (A) 4.0 - 5.6 %       Assessment & Plan:  Essential hypertension  Mixed hyperlipidemia  Erectile dysfunction due to diseases classified elsewhere -     Tadalafil ; Take 0.5-1 tablets (10-20 mg total) by mouth every other day as needed for erectile dysfunction.  Dispense: 10 tablet; Refill: 3  Type 2 diabetes mellitus without complication, without long-term current use of insulin (HCC) -     POCT glycosylated hemoglobin (Hb A1C)  Need for influenza vaccination -     Flu vaccine HIGH DOSE PF(Fluzone Trivalent)  Assessment and Plan Assessment &  Plan Type 2 diabetes mellitus   He has type 2 diabetes mellitus with a recent blood glucose reading of 124 mg/dL. Blurry vision may be related to diabetes, and he is not currently on medication. Refer to a nutritionist for dietary management-[facility], encourage regular exercise, and monitor blood glucose levels regularly. Perform an A1c test today.-at goal 6.4  Diplopia   He experienced a recent episode of diplopia, now resolved, and reports difficulty focusing without glasses. He is [facility] and consider imaging if recommended.  Hypertension   Hypertension is managed with amlodipine  10 mg daily and lisinopril 20/12.5 mg twice daily. Home blood pressure readings are generally within the target range, and the current office reading is 122/80 mmHg. Stress may contribute to elevated readings. Continue the current antihypertensive regimen, encourage stress management techniques, and consider obtaining medications from the [facility] to reduce cost.  Erectile dysfunction   Erectile dysfunction is not responding to daily tadalafil . Plan to switch to as-needed dosing with a higher dose. Discussed the need for stimulation and timing of medication, explaining it should be taken at least an hour before intercourse and lasts up to 36 hours. Discontinue daily tadalafil  and prescribe tadalafil  20mg  for as-needed use. If ineffective, refer to a urologist at the [facility].  General Health Maintenance   Discussed the importance of flu and shingles vaccinations. He has received the shingles vaccine and plans to receive the flu vaccine today. Administer the flu vaccine today and schedule the next shingles vaccine dose in November.    Return in about 3 months (around 08/29/2024) for chronic follow-up.  Jenkins CHRISTELLA Carrel, MD

## 2024-05-30 NOTE — Patient Instructions (Addendum)
 It was very nice to see you today!  Get copy of labs and colonoscopy from [facility]  Tadalafil  sent  Get dates of colonoscopy Copy of eye exam.   PLEASE NOTE:  If you had any lab tests please let us  know if you have not heard back within a few days. You may see your results on MyChart before we have a chance to review them but we will give you a call once they are reviewed by us . If we ordered any referrals today, please let us  know if you have not heard from their office within the next week.   Please try these tips to maintain a healthy lifestyle:  Eat most of your calories during the day when you are active. Eliminate processed foods including packaged sweets (pies, cakes, cookies), reduce intake of potatoes, white bread, white pasta, and white rice. Look for whole grain options, oat flour or almond flour.  Each meal should contain half fruits/vegetables, one quarter protein, and one quarter carbs (no bigger than a computer mouse).  Cut down on sweet beverages. This includes juice, soda, and sweet tea. Also watch fruit intake, though this is a healthier sweet option, it still contains natural sugar! Limit to 3 servings daily.  Drink at least 1 glass of water with each meal and aim for at least 8 glasses per day  Exercise at least 150 minutes every week.

## 2024-06-21 ENCOUNTER — Encounter: Payer: Self-pay | Admitting: Family Medicine

## 2024-06-23 ENCOUNTER — Telehealth: Payer: Self-pay | Admitting: Family Medicine

## 2024-06-29 ENCOUNTER — Ambulatory Visit: Admitting: *Deleted

## 2024-06-29 VITALS — Ht 72.0 in | Wt 190.0 lb

## 2024-06-29 DIAGNOSIS — Z860100 Personal history of colon polyps, unspecified: Secondary | ICD-10-CM

## 2024-06-29 MED ORDER — PEG 3350-KCL-NA BICARB-NACL 420 G PO SOLR: 4000.0000 mL | Freq: Once | ORAL | 0 refills | Status: AC

## 2024-06-29 NOTE — Progress Notes (Signed)
 Pt's name and DOB verified at the beginning of the pre-visit with 2 identifiers  Permission given to speak with wife present  Pt denies any difficulty with ambulating,sitting, laying down or rolling side to side  Pt has no issues moving head neck or swallowing  No egg or soy allergy known to patient   No issues known to pt with past sedation  No FH of Malignant Hyperthermia  Pt is not on home 02   Pt is not on blood thinners   Pt denies issues with constipation   Pt is not on dialysis  Pt denise any abnormal heart rhythms   Pt denies any upcoming cardiac testing  Patient's chart reviewed by Norleen Schillings CNRA prior to pre-visit and patient appropriate for the [facility].  Pre-visit completed and red dot placed by patient's name on their procedure day (on provider's schedule).    Visit in person  Pt states weight is 190lb  Pt given  both [facility] main # and MD on call # prior to instructions.  Informed pt to come in at the time discussed and is shown on PV instructions.  Pt instructed to use Single[employer] or GoodRx for a price reduction on prep  Instructed pt where to find PV instructions in My Ch. Copy of instructions  to be sent in mail and address read back to pt to verify correct on envelope. Instructed pt on all aspects of written instructions including med holds clothing to wear and foods to eat and not eat as well as after procedure legal restrictions and to call MD on call if needed.. Pt states understanding. Instructed pt to review instructions again prior to procedure and call main # given if has any questions or any issues. Pt states they will.

## 2024-07-03 ENCOUNTER — Other Ambulatory Visit: Payer: Self-pay | Admitting: Family Medicine

## 2024-07-03 DIAGNOSIS — N521 Erectile dysfunction due to diseases classified elsewhere: Secondary | ICD-10-CM

## 2024-07-03 NOTE — Telephone Encounter (Unsigned)
 Copied from CRM [id number]. Topic: Clinical - Medication Refill >> Jul 03, 2024 12:04 PM Thersia C wrote: Medication: tadalafil  (CIALIS ) 20 MG tablet  Has the patient contacted their pharmacy? Yes (Agent: If no, request that the patient contact the pharmacy for the refill. If patient does not wish to contact the pharmacy document the reason why and proceed with request.) (Agent: If yes, when and what did the pharmacy advise?)  This is the patient's preferred pharmacy:  [facility]                                 First floor                                 [address] [place], [place] [address]                                 Hours: 8:00 A.M. - 4:30 P.M.  Is this the correct pharmacy for this prescription? Yes If no, delete pharmacy and type the correct one.   Has the prescription been filled recently? No  Is the patient out of the medication? Yes  Has the patient been seen for an appointment in the last year OR does the patient have an upcoming appointment? Yes  Can we respond through MyChart? Yes  Agent: Please be advised that Rx refills may take up to 3 business days. We ask that you follow-up with your pharmacy.

## 2024-07-04 MED ORDER — TADALAFIL 20 MG PO TABS: 10.0000 mg | ORAL_TABLET | ORAL | 3 refills | Status: DC | PRN

## 2024-07-06 ENCOUNTER — Other Ambulatory Visit: Payer: Self-pay

## 2024-07-06 DIAGNOSIS — I10 Essential (primary) hypertension: Secondary | ICD-10-CM

## 2024-07-06 NOTE — Progress Notes (Unsigned)
 Pt asking for refill of amlodipine  and Tadalafil  [facility]. On the tadalafil  patient is asking for an increase dosage to 50mg .

## 2024-07-13 ENCOUNTER — Other Ambulatory Visit: Payer: Self-pay | Admitting: Family Medicine

## 2024-07-16 NOTE — Progress Notes (Unsigned)
 [facility] History and Physical   Primary Care Physician:  Wendolyn Jenkins Jansky, MD   Reason for Procedure:  Surveillance colonoscopy-history of colon polyps  Plan:    Colonoscopy     HPI: Reginald PIDCOCK Sr. is a 68 y.o. male undergoing colonoscopy for history of colon polyps.  Colonoscopy 2006-25 mm descending colon polyp removed in piecemeal fashion-path showed hyperplastic polyp Colonoscopy 2011 -records indicate this was performed at the [facility] and may have shown hyperplastic polyp Patient may have had another colonoscopy in 2016-reports not available  No family history of colorectal cancer or polyps  Patient denies current symptoms of rectal bleeding or change in bowel habits    Past Medical History:  Diagnosis Date   Anxiety and depression    Diabetes mellitus without complication (HCC)    prediabetic   GERD (gastroesophageal reflux disease)    Hard of hearing    Bilat   Hypercholesteremia    Hypertension    Insomnia    Left facial pain    Migraine    PTSD (post-traumatic stress disorder)    Sleep apnea    Substance abuse (HCC)     Past Surgical History:  Procedure Laterality Date   BRAIN SURGERY     for nerves L side   COLONOSCOPY      Prior to Admission medications   Medication Sig Start Date End Date Taking? Authorizing Provider  acetaminophen  (TYLENOL ) 500 MG tablet Take 1 tablet by mouth 3 (three) times daily as needed. 01/19/20   [provider]  amLODipine  (NORVASC ) 10 MG tablet Take 1 tablet (10 mg total) by mouth daily. 02/22/24   Wendolyn Jenkins Jansky, MD  aspirin 81 MG chewable tablet Chew 81 mg by mouth. 02/01/24   [provider]  atorvastatin (LIPITOR) 10 MG tablet TAKE ONE TABLET BY MOUTH AT BEDTIME FOR CHOLESTEROL 05/05/22   [provider]  cyanocobalamin  (VITAMIN B12) 500 MCG tablet Take 1,000 mcg by mouth daily. 05/24/24   [provider]  cyclobenzaprine  (FLEXERIL ) 10 MG tablet Take 10 mg by mouth 3  (three) times daily as needed for muscle spasms.    [provider]  fluticasone (FLONASE) 50 MCG/ACT nasal spray INSTILL 1 SPRAY IN EACH NOSTRIL AT BEDTIME 01/19/22   [provider]  gabapentin  (NEURONTIN ) 300 MG capsule Take 1 capsule by mouth at bedtime. 04/15/21   [provider]  ketoconazole  (NIZORAL ) 2 % cream APPLY TO AFFECTED AREA TWICE A DAY 07/13/24   Kennyth Worth HERO, MD  lidocaine  (LIDODERM ) 5 % APPLY 1 PATCH TO SKIN ONCE DAILY (APPLY FOR 12 HOURS, THEN REMOVE FOR 12 HOURS) 02/10/22   Desiderio Chew, PA-C  lisinopril-hydrochlorothiazide (ZESTORETIC) 20-12.5 MG tablet Take 1 tablet by mouth 2 (two) times daily. 01/19/22   [provider]  loratadine (CLARITIN) 10 MG tablet Take 1 tablet by mouth daily. 07/22/21   [provider]  meclizine  (ANTIVERT ) 25 MG tablet Take 1 tablet (25 mg total) by mouth 3 (three) times daily as needed for dizziness. 06/04/22   Suzette Pac, MD  methocarbamol  (ROBAXIN ) 750 MG tablet Take by mouth. Patient taking differently: Take by mouth as needed. 06/18/23   [provider]  nortriptyline  (PAMELOR ) 75 MG capsule TAKE ONE CAPSULE BY MOUTH AT BEDTIME AS NEEDED (DOSE INCREASE) 12/30/21   [provider]  omeprazole (PRILOSEC) 40 MG capsule Take by mouth. 05/26/23   [provider]  simethicone (MYLICON) 80 MG chewable tablet CHEW TWO TABLETS BY MOUTH DAILY Patient  taking differently: as needed. 07/21/22   [provider]  tadalafil  (CIALIS ) 20 MG tablet Take 0.5-1 tablets (10-20 mg total) by mouth every other day as needed for erectile dysfunction. 07/04/24   Wendolyn Jenkins Jansky, MD  tamsulosin  (FLOMAX ) 0.4 MG CAPS capsule Take 1 capsule by mouth at bedtime. 07/21/22   [provider]  triamcinolone  ointment (KENALOG ) 0.5 % Apply 1 Application topically 2 (two) times daily. Patient taking differently: Apply 1 Application topically as needed. 04/04/24   Kennyth Worth HERO, MD  urea (CARMOL)  20 % cream Apply topically. Patient taking differently: Apply topically as needed. 09/20/23   [provider]  Oxcarbazepine  (TRILEPTAL ) 300 MG tablet Take 1 tablet (300 mg total) by mouth 2 (two) times daily. 04/12/17 09/04/19  Onita Duos, MD  pregabalin  (LYRICA ) 300 MG capsule Take 1 capsule (300 mg total) by mouth 3 (three) times daily. 04/12/17 08/01/19  Onita Duos, MD    Current Outpatient Medications  Medication Sig Dispense Refill   acetaminophen  (TYLENOL ) 500 MG tablet Take 1 tablet by mouth 3 (three) times daily as needed.     amLODipine  (NORVASC ) 10 MG tablet Take 1 tablet (10 mg total) by mouth daily. 90 tablet 1   atorvastatin (LIPITOR) 10 MG tablet TAKE ONE TABLET BY MOUTH AT BEDTIME FOR CHOLESTEROL     cyanocobalamin  (VITAMIN B12) 500 MCG tablet Take 1,000 mcg by mouth daily.     cyclobenzaprine  (FLEXERIL ) 10 MG tablet Take 10 mg by mouth 3 (three) times daily as needed for muscle spasms.     ketoconazole  (NIZORAL ) 2 % cream APPLY TO AFFECTED AREA TWICE A DAY 60 g 0   lisinopril-hydrochlorothiazide (ZESTORETIC) 20-12.5 MG tablet Take 1 tablet by mouth 2 (two) times daily.     omeprazole (PRILOSEC) 40 MG capsule Take by mouth.     simethicone (MYLICON) 80 MG chewable tablet CHEW TWO TABLETS BY MOUTH DAILY (Patient taking differently: as needed.)     triamcinolone  ointment (KENALOG ) 0.5 % Apply 1 Application topically 2 (two) times daily. (Patient taking differently: Apply 1 Application topically as needed.) 30 g 0   urea (CARMOL) 20 % cream Apply topically. (Patient taking differently: Apply topically as needed.)     aspirin 81 MG chewable tablet Chew 81 mg by mouth.     fluticasone (FLONASE) 50 MCG/ACT nasal spray INSTILL 1 SPRAY IN EACH NOSTRIL AT BEDTIME     gabapentin  (NEURONTIN ) 300 MG capsule Take 1 capsule by mouth at bedtime. (Patient not taking: Reported on 07/19/2024)     lidocaine  (LIDODERM ) 5 % APPLY 1 PATCH TO SKIN ONCE DAILY (APPLY FOR 12 HOURS, THEN REMOVE FOR 12  HOURS) 30 patch 0   loratadine (CLARITIN) 10 MG tablet Take 1 tablet by mouth daily.     meclizine  (ANTIVERT ) 25 MG tablet Take 1 tablet (25 mg total) by mouth 3 (three) times daily as needed for dizziness. (Patient not taking: Reported on 07/19/2024) 20 tablet 0   methocarbamol  (ROBAXIN ) 750 MG tablet Take by mouth. (Patient taking differently: Take by mouth as needed.)     nortriptyline  (PAMELOR ) 75 MG capsule TAKE ONE CAPSULE BY MOUTH AT BEDTIME AS NEEDED (DOSE INCREASE)     tadalafil  (CIALIS ) 20 MG tablet Take 0.5-1 tablets (10-20 mg total) by mouth every other day as needed for erectile dysfunction. 10 tablet 3   tamsulosin  (FLOMAX ) 0.4 MG CAPS capsule Take 1 capsule by mouth at bedtime.     Current Facility-Administered Medications  Medication Dose Route Frequency Provider  Last Rate Last Admin   0.9 %  sodium chloride  infusion  500 mL Intravenous Continuous Niesha Bame, Inocente HERO, MD        Allergies as of 07/19/2024   (No Known Allergies)    Family History  Problem Relation Age of Onset   Diabetes Mother    Hypertension Mother    Kidney failure Mother    Colon cancer Father    Hypertension Father    Prostate cancer Father    Diabetes Sister    Cancer Paternal Grandfather        Lung   Colon polyps Neg Hx    Esophageal cancer Neg Hx    Rectal cancer Neg Hx    Stomach cancer Neg Hx     Social History   Socioeconomic History   Marital status: Married    Spouse name: Not on file   Number of children: 2   Years of education: HS   Highest education level: Not on file  Occupational History   Occupation: Disabled  Tobacco Use   Smoking status: Every Day    Current packs/day: 1.00    Types: Cigarettes   Smokeless tobacco: Never  Vaping Use   Vaping status: Never Used  Substance and Sexual Activity   Alcohol use: No    Comment: Quit in 2018   Drug use: Not Currently    Types: Crack cocaine, Marijuana   Sexual activity: Yes  Other Topics Concern   Not on file  Social  History Narrative   Lives at home with wife and two sons.   Right-handed.   3 cups caffeine per day.   Social Drivers of Corporate Investment Banker Strain: Low Risk  (03/26/2022)   Overall Financial Resource Strain (CARDIA)    Difficulty of Paying Living Expenses: Not hard at all  Food Insecurity: No Food Insecurity (03/26/2022)   Hunger Vital Sign    Worried About Running Out of Food in the Last Year: Never true    Ran Out of Food in the Last Year: Never true  Transportation Needs: No Transportation Needs (03/26/2022)   PRAPARE - Administrator, Civil Service (Medical): No    Lack of Transportation (Non-Medical): No  Physical Activity: Inactive (04/12/2023)   Exercise Vital Sign    Days of Exercise per Week: 0 days    Minutes of Exercise per Session: 0 min  Stress: No Stress Concern Present (04/12/2023)   [employer] of Occupational Health - Occupational Stress Questionnaire    Feeling of Stress : Not at all  Social Connections: Moderately Integrated (03/26/2022)   Social Connection and Isolation Panel    Frequency of Communication with Friends and Family: Twice a week    Frequency of Social Gatherings with Friends and Family: Three times a week    Attends Religious Services: More than 4 times per year    Active Member of Clubs or Organizations: No    Attends Banker Meetings: Never    Marital Status: Married  Catering Manager Violence: Not At Risk (03/26/2022)   Humiliation, Afraid, Rape, and Kick questionnaire    Fear of Current or Ex-Partner: No    Emotionally Abused: No    Physically Abused: No    Sexually Abused: No    Review of Systems:  All other review of systems negative except as mentioned in the HPI.  Physical Exam: Vital signs BP (!) 141/102   Pulse 84   Temp 98 F (36.7 C) (Temporal)  Resp 15   Ht 6' (1.829 m)   Wt 185 lb (83.9 kg)   SpO2 98%   BMI 25.09 kg/m   General:   Alert,  Well-developed, well-nourished, pleasant  and cooperative in NAD Airway:  Mallampati 2 Lungs:  Clear throughout to auscultation.   Heart:  Regular rate and rhythm; no murmurs, clicks, rubs,  or gallops. Abdomen:  Soft, nontender and nondistended. Normal bowel sounds.   Neuro/Psych:  Normal mood and affect. A and O x 3  Inocente Hausen, MD [facility]

## 2024-07-17 ENCOUNTER — Encounter: Payer: Self-pay | Admitting: Pediatrics

## 2024-07-18 ENCOUNTER — Ambulatory Visit

## 2024-07-19 ENCOUNTER — Encounter: Payer: Self-pay | Admitting: Pediatrics

## 2024-07-19 ENCOUNTER — Ambulatory Visit (AMBULATORY_SURGERY_CENTER): Admitting: Pediatrics

## 2024-07-19 VITALS — BP 121/92 | HR 93 | Temp 98.0°F | Resp 16 | Ht 72.0 in | Wt 185.0 lb

## 2024-07-19 DIAGNOSIS — Z860100 Personal history of colon polyps, unspecified: Secondary | ICD-10-CM | POA: Diagnosis not present

## 2024-07-19 DIAGNOSIS — D128 Benign neoplasm of rectum: Secondary | ICD-10-CM | POA: Diagnosis not present

## 2024-07-19 DIAGNOSIS — K635 Polyp of colon: Secondary | ICD-10-CM

## 2024-07-19 DIAGNOSIS — D123 Benign neoplasm of transverse colon: Secondary | ICD-10-CM

## 2024-07-19 DIAGNOSIS — Z1211 Encounter for screening for malignant neoplasm of colon: Secondary | ICD-10-CM | POA: Diagnosis present

## 2024-07-19 DIAGNOSIS — K5730 Diverticulosis of large intestine without perforation or abscess without bleeding: Secondary | ICD-10-CM

## 2024-07-19 DIAGNOSIS — K648 Other hemorrhoids: Secondary | ICD-10-CM | POA: Diagnosis not present

## 2024-07-19 MED ORDER — SODIUM CHLORIDE 0.9 % IV SOLN: 500.0000 mL | INTRAVENOUS | Status: DC

## 2024-07-19 NOTE — Op Note (Signed)
 [facility] Patient Name: Reginald Burke Procedure Date: 07/19/2024 3:20 PM MRN: [id number] Endoscopist: Inocente Hausen , MD, [id number] Age: 68 Referring MD:  Date of Birth: 03-Mar-1956 Gender: Male Account #: [id number] Procedure:                Colonoscopy Indications:              High risk colon cancer surveillance: Personal                            history of colonic polyps, Last colonoscopy: 2016;                            previous history of 25 mm descending colon                            hyperplastic polyp in 2006 Medicines:                Monitored Anesthesia Care Procedure:                Pre-Anesthesia Assessment:                           - Prior to the procedure, a History and Physical                            was performed, and patient medications and                            allergies were reviewed. The patient's tolerance of                            previous anesthesia was also reviewed. The risks                            and benefits of the procedure and the sedation                            options and risks were discussed with the patient.                            All questions were answered, and informed consent                            was obtained. Prior Anticoagulants: The patient has                            taken no anticoagulant or antiplatelet agents. ASA                            Grade Assessment: II - A patient with mild systemic                            disease. After reviewing the risks and benefits,  the patient was deemed in satisfactory condition to                            undergo the procedure.                           After obtaining informed consent, the colonoscope                            was passed under direct vision. Throughout the                            procedure, the patient's blood pressure, pulse, and                            oxygen saturations were monitored  continuously. The                            Olympus Scope SN [id number] was introduced through the                            anus and advanced to the cecum, identified by                            appendiceal orifice and ileocecal valve. The                            colonoscopy was performed without difficulty. The                            patient tolerated the procedure well. The quality                            of the bowel preparation was adequate to identify                            polyps greater than 5 mm in size and 20 percent                            obscured. The ileocecal valve, appendiceal orifice,                            and rectum were photographed. Scope In: 3:28:54 PM Scope Out: 3:48:25 PM Scope Withdrawal Time: 0 hours 15 minutes 3 seconds  Total Procedure Duration: 0 hours 19 minutes 31 seconds  Findings:                 The perianal and digital rectal examinations were                            normal. Pertinent negatives include normal                            sphincter tone and no palpable rectal lesions.  A moderate amount of semi-liquid stool was found in                            the entire colon. Lavage of the area was performed                            using a large amount of sterile water, resulting in                            clearance with adequate visualization.                           Multiple small-mouthed diverticula were found in                            the sigmoid colon and descending colon.                           Three sessile polyps were found in the rectum and                            transverse colon. The polyps were 5 to 6 mm in                            size. These polyps were removed with a cold snare.                            Resection and retrieval were complete.                           Internal hemorrhoids were found during retroflexion. Complications:            No immediate  complications. Estimated blood loss:                            Minimal. Estimated Blood Loss:     Estimated blood loss was minimal. Impression:               - Stool in the entire examined colon.                           - Diverticulosis in the sigmoid colon and in the                            descending colon.                           - Three 5 to 6 mm polyps in the rectum and in the                            transverse colon, removed with a cold snare.                            Resected and retrieved.                           -  Internal hemorrhoids. Recommendation:           - Discharge patient to home (ambulatory).                           - Await pathology results.                           - Repeat colonoscopy for surveillance based on                            pathology results. Colonoscopy prep was adequate to                            identify polyps greater than 5 mm. Small polyps                            could have been obscured by residual stool. If                            pathology yields hyperplastic polyps, consider                            repeat colonoscopy in 5 years given that small                            polyps could have been obscured.                           - The findings and recommendations were discussed                            with the patient's family.                           - Patient has a contact number available for                            emergencies. The signs and symptoms of potential                            delayed complications were discussed with the                            patient. Return to normal activities tomorrow.                            Written discharge instructions were provided to the                            patient. Inocente Hausen, MD 07/19/2024 3:55:52 PM This report has been signed electronically.

## 2024-07-19 NOTE — Progress Notes (Signed)
 Pt's states no medical or surgical changes since previsit or office visit.

## 2024-07-19 NOTE — Progress Notes (Signed)
 Called to room to assist during endoscopic procedure.  Patient ID and intended procedure confirmed with present staff. Received instructions for my participation in the procedure from the performing physician.

## 2024-07-19 NOTE — Progress Notes (Signed)
 Report given to PACU, vss

## 2024-07-19 NOTE — Patient Instructions (Signed)
 Discharge instructions given. Handouts polyps,Diverticulosis, and Hemorrhoids. Resume previous medications. YOU HAD AN ENDOSCOPIC PROCEDURE TODAY AT THE [facility]:   Refer to the procedure report that was given to you for any specific questions about what was found during the examination.  If the procedure report does not answer your questions, please call your gastroenterologist to clarify.  If you requested that your care partner not be given the details of your procedure findings, then the procedure report has been included in a sealed envelope for you to review at your convenience later.  YOU SHOULD EXPECT: Some feelings of bloating in the abdomen. Passage of more gas than usual.  Walking can help get rid of the air that was put into your GI tract during the procedure and reduce the bloating. If you had a lower endoscopy (such as a colonoscopy or flexible sigmoidoscopy) you may notice spotting of blood in your stool or on the toilet paper. If you underwent a bowel prep for your procedure, you may not have a normal bowel movement for a few days.  Please Note:  You might notice some irritation and congestion in your nose or some drainage.  This is from the oxygen used during your procedure.  There is no need for concern and it should clear up in a day or so.  SYMPTOMS TO REPORT IMMEDIATELY:  Following lower endoscopy (colonoscopy or flexible sigmoidoscopy):  Excessive amounts of blood in the stool  Significant tenderness or worsening of abdominal pains  Swelling of the abdomen that is new, acute  Fever of 100F or higher   For urgent or emergent issues, a gastroenterologist can be reached at any hour by calling [phone]. Do not use MyChart messaging for urgent concerns.    DIET:  We do recommend a small meal at first, but then you may proceed to your regular diet.  Drink plenty of fluids but you should avoid alcoholic beverages for 24 hours.  ACTIVITY:  You should plan  to take it easy for the rest of today and you should NOT DRIVE or use heavy machinery until tomorrow (because of the sedation medicines used during the test).    FOLLOW UP: Our staff will call the number listed on your records the next business day following your procedure.  We will call around 7:15- 8:00 am to check on you and address any questions or concerns that you may have regarding the information given to you following your procedure. If we do not reach you, we will leave a message.     If any biopsies were taken you will be contacted by phone or by letter within the next 1-3 weeks.  Please call us  at [phone] if you have not heard about the biopsies in 3 weeks.    SIGNATURES/CONFIDENTIALITY: You and/or your care partner have signed paperwork which will be entered into your electronic medical record.  These signatures attest to the fact that that the information above on your After Visit Summary has been reviewed and is understood.  Full responsibility of the confidentiality of this discharge information lies with you and/or your care-partner.

## 2024-07-20 ENCOUNTER — Telehealth: Payer: Self-pay

## 2024-07-20 NOTE — Telephone Encounter (Signed)
  Follow up Call-     07/19/2024    2:25 PM  Call back number  Post procedure Call Back phone  # [phone]  Permission to leave phone message Yes     Patient questions:  Do you have a fever, pain , or abdominal swelling? No. Pain Score  0 *  Have you tolerated food without any problems? Yes.    Have you been able to return to your normal activities? Yes.    Do you have any questions about your discharge instructions: Diet   No. Medications  No. Follow up visit  No.  Do you have questions or concerns about your Care? No.  Actions: * If pain score is 4 or above: No action needed, pain <4.

## 2024-07-24 ENCOUNTER — Ambulatory Visit

## 2024-07-25 ENCOUNTER — Ambulatory Visit: Payer: Self-pay | Admitting: Pediatrics

## 2024-07-25 LAB — SURGICAL PATHOLOGY

## 2024-08-02 ENCOUNTER — Ambulatory Visit

## 2024-08-02 VITALS — BP 122/71 | Ht 71.0 in | Wt 185.0 lb

## 2024-08-02 DIAGNOSIS — Z0000 Encounter for general adult medical examination without abnormal findings: Secondary | ICD-10-CM

## 2024-08-02 NOTE — Patient Instructions (Signed)
 [name],  Thank you for taking the time for your Medicare Wellness Visit. I appreciate your continued commitment to your health goals. Please review the care plan we discussed, and feel free to reach out if I can assist you further.  Please note that Annual Wellness Visits do not include a physical exam. Some assessments may be limited, especially if the visit was conducted virtually. If needed, we may recommend an in-person follow-up with your provider.  Ongoing Care Seeing your primary care provider every 3 to 6 months helps us  monitor your health and provide consistent, personalized care.   Referrals If a referral was made during today's visit and you haven't received any updates within two weeks, please contact the referred provider directly to check on the status.  Recommended Screenings:  Health Maintenance  Topic Date Due   Complete foot exam   Never done   Yearly kidney health urinalysis for diabetes  Never done   Medicare Annual Wellness Visit  04/11/2024   COVID-19 Vaccine (8 - 2025-26 season) 05/15/2024   Zoster (Shingles) Vaccine (2 of 2) 07/19/2024   Hepatitis C Screening  04/04/2025*   Screening for Lung Cancer  09/20/2024   Hemoglobin A1C  11/27/2024   Eye exam for diabetics  12/20/2024   Yearly kidney function blood test for diabetes  02/21/2025   DTaP/Tdap/Td vaccine (3 - Td or Tdap) 07/23/2026   Colon Cancer Screening  07/19/2029   Pneumococcal Vaccine for age over 63  Completed   Flu Shot  Completed   Meningitis B Vaccine  Aged Out  *Topic was postponed. The date shown is not the original due date.       08/02/2024    1:17 PM  Advanced Directives  Does Patient Have a Medical Advance Directive? No  Would patient like information on creating a medical advance directive? No - Patient declined    Vision: Annual vision screenings are recommended for early detection of glaucoma, cataracts, and diabetic retinopathy. These exams can also reveal signs of chronic  conditions such as diabetes and high blood pressure.  Dental: Annual dental screenings help detect early signs of oral cancer, gum disease, and other conditions linked to overall health, including heart disease and diabetes.  Please see the attached documents for additional preventive care recommendations.

## 2024-08-02 NOTE — Progress Notes (Signed)
 Chief Complaint  Patient presents with   Medicare Wellness     Subjective:   Reginald JUNIOUS Sr. is a 68 y.o. male who presents for a Medicare Annual Wellness Visit.  Allergies (verified) Patient has no known allergies.   History: Past Medical History:  Diagnosis Date   Anxiety and depression    Diabetes mellitus without complication (HCC)    prediabetic   GERD (gastroesophageal reflux disease)    Hard of hearing    Bilat   Hypercholesteremia    Hypertension    Insomnia    Left facial pain    Migraine    PTSD (post-traumatic stress disorder)    Sleep apnea    Substance abuse (HCC)    Past Surgical History:  Procedure Laterality Date   BRAIN SURGERY     for nerves L side   COLONOSCOPY     Family History  Problem Relation Age of Onset   Diabetes Mother    Hypertension Mother    Kidney failure Mother    Colon cancer Father    Hypertension Father    Prostate cancer Father    Diabetes Sister    Cancer Paternal Grandfather        Lung   Colon polyps Neg Hx    Esophageal cancer Neg Hx    Rectal cancer Neg Hx    Stomach cancer Neg Hx    Social History   Occupational History   Occupation: Disabled  Tobacco Use   Smoking status: Every Day    Current packs/day: 1.00    Types: Cigarettes   Smokeless tobacco: Never  Vaping Use   Vaping status: Never Used  Substance and Sexual Activity   Alcohol use: No    Comment: Quit in 2018   Drug use: Not Currently    Types: Crack cocaine, Marijuana   Sexual activity: Yes   Tobacco Counseling Ready to quit: Not Answered Counseling given: Not Answered  SDOH Screenings   Food Insecurity: No Food Insecurity (08/02/2024)  Housing: Unknown (08/02/2024)  Transportation Needs: No Transportation Needs (08/02/2024)  Utilities: Not At Risk (08/02/2024)  Depression (PHQ2-9): Low Risk  (08/02/2024)  Recent Concern: Depression (PHQ2-9) - Medium Risk (05/30/2024)  Financial Resource Strain: Low Risk  (03/26/2022)   Physical Activity: Sufficiently Active (08/02/2024)  Social Connections: Moderately Integrated (08/02/2024)  Stress: No Stress Concern Present (08/02/2024)  Tobacco Use: High Risk (08/02/2024)  Health Literacy: Adequate Health Literacy (08/02/2024)   See flowsheets for full screening details  Depression Screen PHQ 2 & 9 Depression Scale- Over the past 2 weeks, how often have you been bothered by any of the following problems? Little interest or pleasure in doing things: 0 Feeling down, depressed, or hopeless (PHQ Adolescent also includes...irritable): 0 PHQ-2 Total Score: 0 Trouble falling or staying asleep, or sleeping too much: 3 Feeling tired or having little energy: 2 Poor appetite or overeating (PHQ Adolescent also includes...weight loss): 0 Feeling bad about yourself - or that you are a failure or have let yourself or your family down: 0 Trouble concentrating on things, such as reading the newspaper or watching television (PHQ Adolescent also includes...like school work): 0 Moving or speaking so slowly that other people could have noticed. Or the opposite - being so fidgety or restless that you have been moving around a lot more than usual: 0 Thoughts that you would be better off dead, or of hurting yourself in some way: 0 PHQ-9 Total Score: 9 If you checked off any problems, how  difficult have these problems made it for you to do your work, take care of things at home, or get along with other people?: Somewhat difficult  Depression Treatment Depression Interventions/Treatment : Currently on Treatment     Goals Addressed               This Visit's Progress     quit smoking (pt-stated)        If you wish to quit smoking, help is available. For free tobacco cessation program offerings call the [facility] at [phone] or Live Well Line at (949)796-2339. You may also visit [email or web address] or email [email or web address] for more information on  other programs.   You may also call 1-800-QUIT-NOW (520-349-0846) or visit www.[employer].ch or www.[email or web address] for additional resources on smoking cessation.         Visit info / Clinical Intake: Medicare Wellness Visit Type:: Subsequent Annual Wellness Visit Persons participating in visit:: patient Medicare Wellness Visit Mode:: Telephone If telephone:: video declined Because this visit was a virtual/telehealth visit:: pt reported vitals If Telephone or Video please confirm:: I connected with the patient using audio enabled telemedicine application and verified that I am speaking with the correct person using two identifiers; I discussed the limitations of evaluation and management by telemedicine; The patient expressed understanding and agreed to proceed Patient Location:: home Provider Location:: home office Information given by:: patient Interpreter Needed?: No Pre-visit prep was completed: yes AWV questionnaire completed by patient prior to visit?: no Living arrangements:: lives with spouse/significant other Patient's Overall Health Status Rating: good Typical amount of pain: some Does pain affect daily life?: (!) yes (back pain) Are you currently prescribed opioids?: no  Dietary Habits and Nutritional Risks How many meals a day?: 3 Eats fruit and vegetables daily?: yes Most meals are obtained by: eating out; preparing own meals In the last 2 weeks, have you had any of the following?: none Diabetic:: (!) yes Any non-healing wounds?: no Would you like to be referred to a Nutritionist or for Diabetic Management? : no  Functional Status Activities of Daily Living (to include ambulation/medication): Independent Ambulation: Independent with device- listed below Home Assistive Devices/Equipment: Cane; CPAP; Eyeglasses Medication Administration: Independent Home Management: Independent Manage your own finances?: yes Primary transportation is: driving Concerns about  vision?: no *vision screening is required for WTM* Concerns about hearing?: (!) yes Uses hearing aids?: (!) yes Hear whispered voice?: yes  Fall Screening Falls in the past year?: 0 Number of falls in past year: 0 Was there an injury with Fall?: 0 Fall Risk Category Calculator: 0 Patient Fall Risk Level: Low Fall Risk  Fall Risk Patient at Risk for Falls Due to: Impaired balance/gait Fall risk Follow up: Falls prevention discussed  Home and Transportation Safety: All rugs have non-skid backing?: N/A, no rugs All stairs or steps have railings?: yes Grab bars in the bathtub or shower?: (!) no Have non-skid surface in bathtub or shower?: yes Good home lighting?: yes Regular seat belt use?: yes Hospital stays in the last year:: no  Cognitive Assessment Difficulty concentrating, remembering, or making decisions? : no Will 6CIT or Mini Cog be Completed: no 6CIT or Mini Cog Declined: patient alert, oriented, able to answer questions appropriately and recall recent events  Advance Directives (For Healthcare) Does Patient Have a Medical Advance Directive?: No Would patient like information on creating a medical advance directive?: No - Patient declined  Reviewed/Updated  Reviewed/Updated: Reviewed All (Medical, Surgical, Family, Medications, Allergies, Care Teams, Patient Goals)  Objective:    Today's Vitals   08/02/24 1312  BP: 122/71  Weight: 185 lb (83.9 kg)  Height: 5' 11 (1.803 m)   Body mass index is 25.8 kg/m.  Current Medications (verified) Outpatient Encounter Medications as of 08/02/2024  Medication Sig   acetaminophen  (TYLENOL ) 500 MG tablet Take 1 tablet by mouth 3 (three) times daily as needed.   amLODipine  (NORVASC ) 10 MG tablet Take 1 tablet (10 mg total) by mouth daily.   aspirin 81 MG chewable tablet Chew 81 mg by mouth.   atorvastatin (LIPITOR) 10 MG tablet TAKE ONE TABLET BY MOUTH AT BEDTIME FOR CHOLESTEROL   cyanocobalamin  (VITAMIN B12) 500  MCG tablet Take 1,000 mcg by mouth daily.   cyclobenzaprine  (FLEXERIL ) 10 MG tablet Take 10 mg by mouth 3 (three) times daily as needed for muscle spasms.   fluticasone (FLONASE) 50 MCG/ACT nasal spray INSTILL 1 SPRAY IN EACH NOSTRIL AT BEDTIME   ketoconazole  (NIZORAL ) 2 % cream APPLY TO AFFECTED AREA TWICE A DAY   lidocaine  (LIDODERM ) 5 % APPLY 1 PATCH TO SKIN ONCE DAILY (APPLY FOR 12 HOURS, THEN REMOVE FOR 12 HOURS)   lisinopril-hydrochlorothiazide (ZESTORETIC) 20-12.5 MG tablet Take 1 tablet by mouth 2 (two) times daily.   loratadine (CLARITIN) 10 MG tablet Take 1 tablet by mouth daily.   meclizine  (ANTIVERT ) 25 MG tablet Take 1 tablet (25 mg total) by mouth 3 (three) times daily as needed for dizziness.   methocarbamol  (ROBAXIN ) 750 MG tablet Take by mouth. (Patient taking differently: Take by mouth as needed.)   nortriptyline  (PAMELOR ) 75 MG capsule TAKE ONE CAPSULE BY MOUTH AT BEDTIME AS NEEDED (DOSE INCREASE)   omeprazole (PRILOSEC) 40 MG capsule Take by mouth.   simethicone (MYLICON) 80 MG chewable tablet CHEW TWO TABLETS BY MOUTH DAILY (Patient taking differently: as needed.)   tadalafil  (CIALIS ) 20 MG tablet Take 0.5-1 tablets (10-20 mg total) by mouth every other day as needed for erectile dysfunction.   tamsulosin  (FLOMAX ) 0.4 MG CAPS capsule Take 1 capsule by mouth at bedtime.   triamcinolone  ointment (KENALOG ) 0.5 % Apply 1 Application topically 2 (two) times daily.   urea (CARMOL) 20 % cream Apply topically.   gabapentin  (NEURONTIN ) 300 MG capsule Take 1 capsule by mouth at bedtime. (Patient not taking: Reported on 08/02/2024)   [DISCONTINUED] Oxcarbazepine  (TRILEPTAL ) 300 MG tablet Take 1 tablet (300 mg total) by mouth 2 (two) times daily.   [DISCONTINUED] pregabalin  (LYRICA ) 300 MG capsule Take 1 capsule (300 mg total) by mouth 3 (three) times daily.   No facility-administered encounter medications on file as of 08/02/2024.   Hearing/Vision screen Hearing Screening -  Comments:: Pt stated hearing aids  Vision Screening - Comments:: Wears rx glasses - up to date with routine eye exams with [facility]  Immunizations and Health Maintenance Health Maintenance  Topic Date Due   FOOT EXAM  Never done   Diabetic kidney evaluation - Urine ACR  Never done   COVID-19 Vaccine (8 - 2025-26 season) 05/15/2024   Zoster Vaccines- Shingrix (2 of 2) 07/19/2024   Hepatitis C Screening  04/04/2025 (Originally 06/15/1974)   Lung Cancer Screening  09/20/2024   HEMOGLOBIN A1C  11/27/2024   OPHTHALMOLOGY EXAM  12/20/2024   Diabetic kidney evaluation - eGFR measurement  02/21/2025   Medicare Annual Wellness (AWV)  08/02/2025   DTaP/Tdap/Td (3 - Td or Tdap) 07/23/2026   Colonoscopy  07/19/2029   Pneumococcal Vaccine: 50+ Years  Completed   Influenza Vaccine  Completed  Meningococcal B Vaccine  Aged Out        Assessment/Plan:  This is a routine wellness examination for [place].  Patient Care Team: Wendolyn Jenkins Jansky, MD as PCP - General (Family Medicine) Benjaman Standing, DMD (Oral Surgery) Tobie, Janus Von Mealing, MD as Referring Physician (Anesthesiology) Flynt, Almarie [place], MD as Referring Physician (Pain Medicine)  I have personally reviewed and noted the following in the patient's chart:   Medical and social history Use of alcohol, tobacco or illicit drugs  Current medications and supplements including opioid prescriptions. Functional ability and status Nutritional status Physical activity Advanced directives List of other physicians Hospitalizations, surgeries, and ER visits in previous 12 months Vitals Screenings to include cognitive, depression, and falls Referrals and appointments  No orders of the defined types were placed in this encounter.  In addition, I have reviewed and discussed with patient certain preventive protocols, quality metrics, and best practice recommendations. A written personalized care plan for preventive services as  well as general preventive health recommendations were provided to patient.   [place] VEAR Haws, LPN   88/80/7974   Return in about 1 year (around 08/13/2025) for AWV .  After Visit Summary: (Pick Up) Due to this being a telephonic visit, with patients personalized plan was offered to patient and patient has requested to Pick up at office.  Nurse Notes: none  Chief Complaint  Patient presents with   Medicare Wellness     Subjective:   Reginald GOBIN Sr. is a 67 y.o. male who presents for a Medicare Annual Wellness Visit.  Allergies (verified) Patient has no known allergies.   History: Past Medical History:  Diagnosis Date   Anxiety and depression    Diabetes mellitus without complication (HCC)    prediabetic   GERD (gastroesophageal reflux disease)    Hard of hearing    Bilat   Hypercholesteremia    Hypertension    Insomnia    Left facial pain    Migraine    PTSD (post-traumatic stress disorder)    Sleep apnea    Substance abuse (HCC)    Past Surgical History:  Procedure Laterality Date   BRAIN SURGERY     for nerves L side   COLONOSCOPY     Family History  Problem Relation Age of Onset   Diabetes Mother    Hypertension Mother    Kidney failure Mother    Colon cancer Father    Hypertension Father    Prostate cancer Father    Diabetes Sister    Cancer Paternal Grandfather        Lung   Colon polyps Neg Hx    Esophageal cancer Neg Hx    Rectal cancer Neg Hx    Stomach cancer Neg Hx    Social History   Occupational History   Occupation: Disabled  Tobacco Use   Smoking status: Every Day    Current packs/day: 1.00    Types: Cigarettes   Smokeless tobacco: Never  Vaping Use   Vaping status: Never Used  Substance and Sexual Activity   Alcohol use: No    Comment: Quit in 2018   Drug use: Not Currently    Types: Crack cocaine, Marijuana   Sexual activity: Yes   Tobacco Counseling Ready to quit: Not Answered Counseling given: Not  Answered  SDOH Screenings   Food Insecurity: No Food Insecurity (08/02/2024)  Housing: Unknown (08/02/2024)  Transportation Needs: No Transportation Needs (08/02/2024)  Utilities: Not At Risk (08/02/2024)  Depression (PHQ2-9):  Low Risk  (08/02/2024)  Recent Concern: Depression (PHQ2-9) - Medium Risk (05/30/2024)  Financial Resource Strain: Low Risk  (03/26/2022)  Physical Activity: Sufficiently Active (08/02/2024)  Social Connections: Moderately Integrated (08/02/2024)  Stress: No Stress Concern Present (08/02/2024)  Tobacco Use: High Risk (08/02/2024)  Health Literacy: Adequate Health Literacy (08/02/2024)   See flowsheets for full screening details  Depression Screen PHQ 2 & 9 Depression Scale- Over the past 2 weeks, how often have you been bothered by any of the following problems? Little interest or pleasure in doing things: 0 Feeling down, depressed, or hopeless (PHQ Adolescent also includes...irritable): 0 PHQ-2 Total Score: 0 Trouble falling or staying asleep, or sleeping too much: 3 Feeling tired or having little energy: 2 Poor appetite or overeating (PHQ Adolescent also includes...weight loss): 0 Feeling bad about yourself - or that you are a failure or have let yourself or your family down: 0 Trouble concentrating on things, such as reading the newspaper or watching television (PHQ Adolescent also includes...like school work): 0 Moving or speaking so slowly that other people could have noticed. Or the opposite - being so fidgety or restless that you have been moving around a lot more than usual: 0 Thoughts that you would be better off dead, or of hurting yourself in some way: 0 PHQ-9 Total Score: 9 If you checked off any problems, how difficult have these problems made it for you to do your work, take care of things at home, or get along with other people?: Somewhat difficult  Depression Treatment Depression Interventions/Treatment : Currently on Treatment     Goals  Addressed               This Visit's Progress     quit smoking (pt-stated)        If you wish to quit smoking, help is available. For free tobacco cessation program offerings call the [facility] at ([phone] or Live Well Line at 503-454-9269. You may also visit [email or web address] or email [email or web address] for more information on other programs.   You may also call 1-800-QUIT-NOW ((281)414-5417) or visit www.[employer].ch or www.[email or web address] for additional resources on smoking cessation.         Visit info / Clinical Intake: Medicare Wellness Visit Type:: Subsequent Annual Wellness Visit Persons participating in visit:: patient Medicare Wellness Visit Mode:: Telephone If telephone:: video declined Because this visit was a virtual/telehealth visit:: pt reported vitals If Telephone or Video please confirm:: I connected with the patient using audio enabled telemedicine application and verified that I am speaking with the correct person using two identifiers; I discussed the limitations of evaluation and management by telemedicine; The patient expressed understanding and agreed to proceed Patient Location:: home Provider Location:: home office Information given by:: patient Interpreter Needed?: No Pre-visit prep was completed: yes AWV questionnaire completed by patient prior to visit?: no Living arrangements:: lives with spouse/significant other Patient's Overall Health Status Rating: good Typical amount of pain: some Does pain affect daily life?: (!) yes (back pain) Are you currently prescribed opioids?: no  Dietary Habits and Nutritional Risks How many meals a day?: 3 Eats fruit and vegetables daily?: yes Most meals are obtained by: eating out; preparing own meals In the last 2 weeks, have you had any of the following?: none Diabetic:: (!) yes Any non-healing wounds?: no Would you like to be referred to a Nutritionist or for Diabetic  Management? : no  Functional Status Activities of Daily Living (to include ambulation/medication): Independent Ambulation: Independent  with device- listed below Home Assistive Devices/Equipment: Cane; CPAP; Eyeglasses Medication Administration: Independent Home Management: Independent Manage your own finances?: yes Primary transportation is: driving Concerns about vision?: no *vision screening is required for WTM* Concerns about hearing?: (!) yes Uses hearing aids?: (!) yes Hear whispered voice?: yes  Fall Screening Falls in the past year?: 0 Number of falls in past year: 0 Was there an injury with Fall?: 0 Fall Risk Category Calculator: 0 Patient Fall Risk Level: Low Fall Risk  Fall Risk Patient at Risk for Falls Due to: Impaired balance/gait Fall risk Follow up: Falls prevention discussed  Home and Transportation Safety: All rugs have non-skid backing?: N/A, no rugs All stairs or steps have railings?: yes Grab bars in the bathtub or shower?: (!) no Have non-skid surface in bathtub or shower?: yes Good home lighting?: yes Regular seat belt use?: yes Hospital stays in the last year:: no  Cognitive Assessment Difficulty concentrating, remembering, or making decisions? : no Will 6CIT or Mini Cog be Completed: no 6CIT or Mini Cog Declined: patient alert, oriented, able to answer questions appropriately and recall recent events  Advance Directives (For Healthcare) Does Patient Have a Medical Advance Directive?: No Would patient like information on creating a medical advance directive?: No - Patient declined  Reviewed/Updated  Reviewed/Updated: Reviewed All (Medical, Surgical, Family, Medications, Allergies, Care Teams, Patient Goals)        Objective:    Today's Vitals   08/02/24 1312  BP: 122/71  Weight: 185 lb (83.9 kg)  Height: 5' 11 (1.803 m)   Body mass index is 25.8 kg/m.  Current Medications (verified) Outpatient Encounter Medications as of 08/02/2024   Medication Sig   acetaminophen  (TYLENOL ) 500 MG tablet Take 1 tablet by mouth 3 (three) times daily as needed.   amLODipine  (NORVASC ) 10 MG tablet Take 1 tablet (10 mg total) by mouth daily.   aspirin 81 MG chewable tablet Chew 81 mg by mouth.   atorvastatin (LIPITOR) 10 MG tablet TAKE ONE TABLET BY MOUTH AT BEDTIME FOR CHOLESTEROL   cyanocobalamin  (VITAMIN B12) 500 MCG tablet Take 1,000 mcg by mouth daily.   cyclobenzaprine  (FLEXERIL ) 10 MG tablet Take 10 mg by mouth 3 (three) times daily as needed for muscle spasms.   fluticasone (FLONASE) 50 MCG/ACT nasal spray INSTILL 1 SPRAY IN EACH NOSTRIL AT BEDTIME   ketoconazole  (NIZORAL ) 2 % cream APPLY TO AFFECTED AREA TWICE A DAY   lidocaine  (LIDODERM ) 5 % APPLY 1 PATCH TO SKIN ONCE DAILY (APPLY FOR 12 HOURS, THEN REMOVE FOR 12 HOURS)   lisinopril-hydrochlorothiazide (ZESTORETIC) 20-12.5 MG tablet Take 1 tablet by mouth 2 (two) times daily.   loratadine (CLARITIN) 10 MG tablet Take 1 tablet by mouth daily.   meclizine  (ANTIVERT ) 25 MG tablet Take 1 tablet (25 mg total) by mouth 3 (three) times daily as needed for dizziness.   methocarbamol  (ROBAXIN ) 750 MG tablet Take by mouth. (Patient taking differently: Take by mouth as needed.)   nortriptyline  (PAMELOR ) 75 MG capsule TAKE ONE CAPSULE BY MOUTH AT BEDTIME AS NEEDED (DOSE INCREASE)   omeprazole (PRILOSEC) 40 MG capsule Take by mouth.   simethicone (MYLICON) 80 MG chewable tablet CHEW TWO TABLETS BY MOUTH DAILY (Patient taking differently: as needed.)   tadalafil  (CIALIS ) 20 MG tablet Take 0.5-1 tablets (10-20 mg total) by mouth every other day as needed for erectile dysfunction.   tamsulosin  (FLOMAX ) 0.4 MG CAPS capsule Take 1 capsule by mouth at bedtime.   triamcinolone  ointment (KENALOG ) 0.5 % Apply  1 Application topically 2 (two) times daily.   urea (CARMOL) 20 % cream Apply topically.   gabapentin  (NEURONTIN ) 300 MG capsule Take 1 capsule by mouth at bedtime. (Patient not taking: Reported on  08/02/2024)   [DISCONTINUED] Oxcarbazepine  (TRILEPTAL ) 300 MG tablet Take 1 tablet (300 mg total) by mouth 2 (two) times daily.   [DISCONTINUED] pregabalin  (LYRICA ) 300 MG capsule Take 1 capsule (300 mg total) by mouth 3 (three) times daily.   No facility-administered encounter medications on file as of 08/02/2024.   Hearing/Vision screen Hearing Screening - Comments:: Pt stated hearing aids  Vision Screening - Comments:: Wears rx glasses - up to date with routine eye exams with [facility]  Immunizations and Health Maintenance Health Maintenance  Topic Date Due   FOOT EXAM  Never done   Diabetic kidney evaluation - Urine ACR  Never done   COVID-19 Vaccine (8 - 2025-26 season) 05/15/2024   Zoster Vaccines- Shingrix (2 of 2) 07/19/2024   Hepatitis C Screening  04/04/2025 (Originally 06/15/1974)   Lung Cancer Screening  09/20/2024   HEMOGLOBIN A1C  11/27/2024   OPHTHALMOLOGY EXAM  12/20/2024   Diabetic kidney evaluation - eGFR measurement  02/21/2025   Medicare Annual Wellness (AWV)  08/02/2025   DTaP/Tdap/Td (3 - Td or Tdap) 07/23/2026   Colonoscopy  07/19/2029   Pneumococcal Vaccine: 50+ Years  Completed   Influenza Vaccine  Completed   Meningococcal B Vaccine  Aged Out        Assessment/Plan:  This is a routine wellness examination for [place].  Patient Care Team: Wendolyn Jenkins Jansky, MD as PCP - General (Family Medicine) Benjaman Standing, DMD (Oral Surgery) Tobie, Janus Von Mealing, MD as Referring Physician (Anesthesiology) Flynt, Almarie [place], MD as Referring Physician (Pain Medicine)  I have personally reviewed and noted the following in the patient's chart:   Medical and social history Use of alcohol, tobacco or illicit drugs  Current medications and supplements including opioid prescriptions. Functional ability and status Nutritional status Physical activity Advanced directives List of other physicians Hospitalizations, surgeries, and ER visits in previous  12 months Vitals Screenings to include cognitive, depression, and falls Referrals and appointments  No orders of the defined types were placed in this encounter.  In addition, I have reviewed and discussed with patient certain preventive protocols, quality metrics, and best practice recommendations. A written personalized care plan for preventive services as well as general preventive health recommendations were provided to patient.   [place] VEAR Haws, LPN   88/80/7974   Return in about 1 year (around 08/13/2025) for AWV .  After Visit Summary: (Pick Up) Due to this being a telephonic visit, with patients personalized plan was offered to patient and patient has requested to Pick up at office.  Nurse Notes: None

## 2024-08-27 ENCOUNTER — Other Ambulatory Visit: Payer: Self-pay

## 2024-08-27 ENCOUNTER — Emergency Department (HOSPITAL_COMMUNITY): Admission: EM | Admit: 2024-08-27 | Discharge: 2024-08-27 | Disposition: A | Discharge status: Home / Self Care

## 2024-08-27 DIAGNOSIS — M5441 Lumbago with sciatica, right side: Secondary | ICD-10-CM

## 2024-08-27 DIAGNOSIS — M545 Low back pain, unspecified: Secondary | ICD-10-CM | POA: Diagnosis not present

## 2024-08-27 DIAGNOSIS — G8929 Other chronic pain: Secondary | ICD-10-CM | POA: Diagnosis not present

## 2024-08-27 DIAGNOSIS — M549 Dorsalgia, unspecified: Secondary | ICD-10-CM | POA: Diagnosis present

## 2024-08-27 DIAGNOSIS — Z7982 Long term (current) use of aspirin: Secondary | ICD-10-CM | POA: Diagnosis not present

## 2024-08-27 MED ORDER — ACETAMINOPHEN 325 MG PO TABS
650.0000 mg | ORAL_TABLET | Freq: Once | ORAL | Status: AC
  Administered 2024-08-27: 650 mg via ORAL
  Filled 2024-08-27: qty 2

## 2024-08-27 MED ORDER — LIDOCAINE 5 % EX PTCH: 1.0000 | MEDICATED_PATCH | CUTANEOUS | 0 refills | Status: AC

## 2024-08-27 MED ORDER — METHOCARBAMOL 500 MG PO TABS: 500.0000 mg | ORAL_TABLET | Freq: Two times a day (BID) | ORAL | 0 refills | Status: DC | PRN

## 2024-08-27 MED ORDER — METHOCARBAMOL 500 MG PO TABS
500.0000 mg | ORAL_TABLET | Freq: Once | ORAL | Status: AC
  Administered 2024-08-27: 500 mg via ORAL
  Filled 2024-08-27: qty 1

## 2024-08-27 MED ORDER — DEXAMETHASONE 4 MG PO TABS
4.0000 mg | ORAL_TABLET | Freq: Once | ORAL | Status: AC
  Administered 2024-08-27: 4 mg via ORAL
  Filled 2024-08-27: qty 1

## 2024-08-27 MED ORDER — LIDOCAINE 5 % EX PTCH
1.0000 | MEDICATED_PATCH | CUTANEOUS | Status: DC
  Administered 2024-08-27: 1 via TRANSDERMAL
  Filled 2024-08-27: qty 1

## 2024-08-27 MED ORDER — KETOROLAC TROMETHAMINE 15 MG/ML IJ SOLN
15.0000 mg | Freq: Once | INTRAMUSCULAR | Status: AC
  Administered 2024-08-27: 15 mg via INTRAMUSCULAR
  Filled 2024-08-27: qty 1

## 2024-08-27 NOTE — ED Provider Notes (Signed)
 [place] EMERGENCY DEPARTMENT AT [facility] Provider Note   CSN: [id number] Arrival date & time: 08/27/24  9052     Patient presents with: Back Pain   Reginald BRUNTON Sr. is a 68 y.o. male.   68 year old male presenting emergency for back pain.  Has history of chronic back pain.  Worsened 4 days ago after doing yard work.  Pain radiates down his right leg.  No numbness or weakness.  No saddle anesthesia.  No bowel or bladder incontinence.  No fevers or chills.   Back Pain      Prior to Admission medications  Medication Sig Start Date End Date Taking? Authorizing Provider  acetaminophen  (TYLENOL ) 500 MG tablet Take 1 tablet by mouth 3 (three) times daily as needed. 01/19/20   [provider]  amLODipine  (NORVASC ) 10 MG tablet Take 1 tablet (10 mg total) by mouth daily. 02/22/24   Wendolyn Jenkins Jansky, MD  aspirin 81 MG chewable tablet Chew 81 mg by mouth. 02/01/24   [provider]  atorvastatin (LIPITOR) 10 MG tablet TAKE ONE TABLET BY MOUTH AT BEDTIME FOR CHOLESTEROL 05/05/22   [provider]  cyanocobalamin  (VITAMIN B12) 500 MCG tablet Take 1,000 mcg by mouth daily. 05/24/24   [provider]  cyclobenzaprine  (FLEXERIL ) 10 MG tablet Take 10 mg by mouth 3 (three) times daily as needed for muscle spasms.    [provider]  fluticasone (FLONASE) 50 MCG/ACT nasal spray INSTILL 1 SPRAY IN EACH NOSTRIL AT BEDTIME 01/19/22   [provider]  gabapentin  (NEURONTIN ) 300 MG capsule Take 1 capsule by mouth at bedtime. Patient not taking: Reported on 08/02/2024 04/15/21   [provider]  ketoconazole  (NIZORAL ) 2 % cream APPLY TO AFFECTED AREA TWICE A DAY 07/13/24   Kennyth Worth HERO, MD  lidocaine  (LIDODERM ) 5 % APPLY 1 PATCH TO SKIN ONCE DAILY (APPLY FOR 12 HOURS, THEN REMOVE FOR 12 HOURS) 02/10/22   Desiderio Chew, PA-C  lisinopril-hydrochlorothiazide (ZESTORETIC) 20-12.5 MG tablet Take 1 tablet by mouth 2 (two) times daily.  01/19/22   [provider]  loratadine (CLARITIN) 10 MG tablet Take 1 tablet by mouth daily. 07/22/21   [provider]  meclizine  (ANTIVERT ) 25 MG tablet Take 1 tablet (25 mg total) by mouth 3 (three) times daily as needed for dizziness. 06/04/22   Suzette Pac, MD  methocarbamol  (ROBAXIN ) 750 MG tablet Take by mouth. Patient taking differently: Take by mouth as needed. 06/18/23   [provider]  nortriptyline  (PAMELOR ) 75 MG capsule TAKE ONE CAPSULE BY MOUTH AT BEDTIME AS NEEDED (DOSE INCREASE) 12/30/21   [provider]  omeprazole (PRILOSEC) 40 MG capsule Take by mouth. 05/26/23   [provider]  simethicone (MYLICON) 80 MG chewable tablet CHEW TWO TABLETS BY MOUTH DAILY Patient taking differently: as needed. 07/21/22   [provider]  tadalafil  (CIALIS ) 20 MG tablet Take 0.5-1 tablets (10-20 mg total) by mouth every other day as needed for erectile dysfunction. 07/04/24   Wendolyn Jenkins Jansky, MD  tamsulosin  (FLOMAX ) 0.4 MG CAPS capsule Take 1 capsule by mouth at bedtime. 07/21/22   [provider]  triamcinolone  ointment (KENALOG ) 0.5 % Apply 1 Application topically 2 (two) times daily. 04/04/24   Kennyth Worth HERO, MD  urea (CARMOL) 20 % cream Apply topically. 09/20/23   [provider]  Oxcarbazepine  (TRILEPTAL ) 300 MG tablet Take 1 tablet (300 mg total) by mouth 2 (two) times daily. 04/12/17 09/04/19  Onita Duos, MD  pregabalin  (  LYRICA ) 300 MG capsule Take 1 capsule (300 mg total) by mouth 3 (three) times daily. 04/12/17 08/01/19  Onita Duos, MD    Allergies: Patient has no known allergies.    Review of Systems  Musculoskeletal:  Positive for back pain.    Updated Vital Signs BP 121/89 (BP Location: Left Arm)   Pulse 99   Temp 98.8 F (37.1 C) (Oral)   Resp 16   SpO2 97%   Physical Exam Vitals and nursing note reviewed.  Constitutional:      General: He is not in acute distress.    Appearance: He is not  toxic-appearing.  HENT:     Head: Normocephalic.     Mouth/Throat:     Mouth: Mucous membranes are moist.  Eyes:     Conjunctiva/sclera: Conjunctivae normal.  Cardiovascular:     Pulses: Normal pulses.  Pulmonary:     Effort: Pulmonary effort is normal.  Abdominal:     General: Abdomen is flat. There is no distension.     Palpations: Abdomen is soft.  Musculoskeletal:     Comments: No midline spinal tenderness.  Does have some tenderness to the quadratus lumborum on the right-hand side near the iliac crest.  5 out of 5 plantarflexion dorsiflexion, hip extension.  Normal sensation.  Skin:    General: Skin is warm.     Capillary Refill: Capillary refill takes less than 2 seconds.  Neurological:     Mental Status: He is alert and oriented to person, place, and time.  Psychiatric:        Mood and Affect: Mood normal.        Behavior: Behavior normal.     (all labs ordered are listed, but only abnormal results are displayed) Labs Reviewed - No data to display  EKG: None  Radiology: No results found.   Procedures   Medications Ordered in the ED  lidocaine  (LIDODERM ) 5 % 1 patch (1 patch Transdermal Patch Applied 08/27/24 1228)  acetaminophen  (TYLENOL ) tablet 650 mg (650 mg Oral Given 08/27/24 1219)  ketorolac  (TORADOL ) 15 MG/ML injection 15 mg (15 mg Intramuscular Given 08/27/24 1214)  methocarbamol  (ROBAXIN ) tablet 500 mg (500 mg Oral Given 08/27/24 1219)  dexamethasone  (DECADRON ) tablet 4 mg (4 mg Oral Given 08/27/24 1220)                                    Medical Decision Making 68 year old male presenting emergency department back pain.  Acute on chronic.  No red flags on history or physical exam.  Low suspicion for acute cord compression.  Likely MSK secondary to yard work.  Treat with multimodal pain medications with great improvement of his symptoms.  Discussed supportive care and follow-up with PCP.  Return precautions given.  Stable for discharge.  Risk OTC  drugs. Prescription drug management.       Final diagnoses:  None    ED Discharge Orders     None          Neysa Caron PARAS, DO 08/27/24 1319

## 2024-08-27 NOTE — Discharge Instructions (Signed)
 Take Tylenol  alternate with ibuprofen for pain you may also use lidocaine  patches and muscle relaxers for further pain relief.  Please use caution as needed muscle relaxer can cause delirium and predispose you to falling.  Return for fevers, chills, severe pain, weakness in your legs, bowel or bladder incontinence, numbness in your genital area or any new or worsening symptoms that are concerning to you.

## 2024-08-27 NOTE — ED Triage Notes (Signed)
 Pt arrives via wheelchair accompanied by wife with complaints of low back pain that is worse on the RIGHT side. Pt states that the pain began 3-4 days ago and has persisted. Pt endorses a hx of similar back pain.

## 2024-08-28 ENCOUNTER — Telehealth: Payer: Self-pay

## 2024-08-28 NOTE — Telephone Encounter (Signed)
 Transition Care Management Unsuccessful Follow-up Telephone Call  Date of discharge and from where:  [facility]/27/[facility]  Attempts:  1st Attempt  Reason for unsuccessful TCM follow-up call:  Unable to leave message, vm box full

## 2024-08-29 ENCOUNTER — Ambulatory Visit: Admitting: Family Medicine

## 2024-09-22 ENCOUNTER — Encounter: Payer: Self-pay | Admitting: Family Medicine

## 2024-09-22 ENCOUNTER — Ambulatory Visit: Admitting: Family Medicine

## 2024-09-22 VITALS — BP 126/74 | HR 82 | Temp 97.0°F | Ht 71.0 in | Wt 205.2 lb

## 2024-09-22 DIAGNOSIS — R350 Frequency of micturition: Secondary | ICD-10-CM

## 2024-09-22 DIAGNOSIS — I10 Essential (primary) hypertension: Secondary | ICD-10-CM

## 2024-09-22 DIAGNOSIS — M5412 Radiculopathy, cervical region: Secondary | ICD-10-CM | POA: Diagnosis not present

## 2024-09-22 DIAGNOSIS — K219 Gastro-esophageal reflux disease without esophagitis: Secondary | ICD-10-CM | POA: Diagnosis not present

## 2024-09-22 DIAGNOSIS — E119 Type 2 diabetes mellitus without complications: Secondary | ICD-10-CM | POA: Diagnosis not present

## 2024-09-22 DIAGNOSIS — N521 Erectile dysfunction due to diseases classified elsewhere: Secondary | ICD-10-CM | POA: Diagnosis not present

## 2024-09-22 DIAGNOSIS — N401 Enlarged prostate with lower urinary tract symptoms: Secondary | ICD-10-CM | POA: Diagnosis not present

## 2024-09-22 DIAGNOSIS — E782 Mixed hyperlipidemia: Secondary | ICD-10-CM | POA: Diagnosis not present

## 2024-09-22 DIAGNOSIS — F1421 Cocaine dependence, in remission: Secondary | ICD-10-CM

## 2024-09-22 MED ORDER — TADALAFIL 20 MG PO TABS: 10.0000 mg | ORAL_TABLET | ORAL | 3 refills | Status: AC | PRN

## 2024-09-22 MED ORDER — AMLODIPINE BESYLATE 10 MG PO TABS: 10.0000 mg | ORAL_TABLET | Freq: Every day | ORAL | 1 refills | Status: AC

## 2024-09-22 MED ORDER — METHOCARBAMOL 500 MG PO TABS: 500.0000 mg | ORAL_TABLET | Freq: Two times a day (BID) | ORAL | 3 refills | Status: AC | PRN

## 2024-09-22 NOTE — Patient Instructions (Signed)
 [facility]age Tens [facility]

## 2024-09-22 NOTE — Progress Notes (Unsigned)
 "  Subjective:     Patient ID: Reginald LITTIE Seip Sr., male    DOB: 09-Dec-1955, 69 y.o.   MRN: [id number]  Chief Complaint  Patient presents with   Hypertension    Pt is here for chronic issues    Discussed the use of AI scribe software for clinical note transcription with the patient, who gave verbal consent to proceed.  History of Present Illness Reginald Allender. is a 69 year old male with diabetes, hypertension, and hyperlipidemia who presents with musculoskeletal pain.  He has been experiencing pain in his neck, shoulders, and left arm for about a week. The pain is described as spasms similar to those in his lower back, which he has experienced previously. The pain in the left arm is intermittent but worsens at night, affecting his sleep. No radiation of pain to the chest or upper back. He has been using methocarbamol  500 mg twice daily, which he finds effective, and acetaminophen  500 mg three times daily for pain management.  He has a history of hypertension managed with amlodipine  10 mg and lisinopril HCT 20/12.5 mg daily. He monitors his blood pressure at home and reports improvement since starting these medications. No symptoms of headache, dizziness, chest pain, shortness of breath, or swelling in the legs.  For hyperlipidemia, he takes atorvastatin 10 mg daily. He does not report any issues related to cholesterol management.  He has a history of benign prostatic hyperplasia (BPH) and takes tamsulosin  0.4 mg at bedtime, which he reports is effective for urination.  He experiences gastroesophageal reflux disease (GERD) and takes omeprazole daily, which he obtains from the [facility] and reports it is effective in managing his symptoms.  He is not currently on medication for diabetes and is managing it through diet and exercise. He reports regular physical activity, including going to the gym and walking.  He continues to smoke and has not yet quit. He has undergone lung cancer screening  through the [facility] and is due for another screening soon.    Health Maintenance Due  Topic Date Due   FOOT EXAM  Never done   Diabetic kidney evaluation - Urine ACR  Never done   Lung Cancer Screening  09/20/2024    Past Medical History:  Diagnosis Date   Anxiety and depression    Diabetes mellitus without complication (HCC)    prediabetic   GERD (gastroesophageal reflux disease)    Hard of hearing    Bilat   Hypercholesteremia    Hypertension    Insomnia    Left facial pain    Migraine    PTSD (post-traumatic stress disorder)    Sleep apnea    Substance abuse (HCC)     Past Surgical History:  Procedure Laterality Date   BRAIN SURGERY     for nerves L side   COLONOSCOPY      Current Medications[1]  Allergies[2] ROS neg/noncontributory except as noted HPI/below      Objective:     BP 126/74 (BP Location: Left Arm, Patient Position: Sitting)   Pulse 82   Temp (!) 97 F (36.1 C) (Temporal)   Ht 5' 11 (1.803 m)   Wt 205 lb 4 oz (93.1 kg)   SpO2 98%   BMI 28.63 kg/m  Wt Readings from Last 3 Encounters:  09/22/24 205 lb 4 oz (93.1 kg)  08/02/24 185 lb (83.9 kg)  07/19/24 185 lb (83.9 kg)    Physical Exam MEASUREMENTS: Weight- 203. GENERAL: Well developed, well  nourished, no acute distress. HEAD EYES EARS NOSE THROAT: Normocephalic, atraumatic, conjunctiva not injected, sclera nonicteric. CARDIAC: Regular rate and rhythm, S1 S2 present, no murmur, dorsalis pedis 2 plus bilaterally. NECK: Supple, no thyromegaly, no nodes, no carotid bruits. Tenderness on palpation, increased tenderness on left side, muscle tightness and spasms noted, pinched nerve sensation due to muscle tightness. LUNGS: Clear to auscultation bilaterally, no wheezes. ABDOMEN: Bowel sounds present, soft, non-tender, non-distended, no hepatosplenomegaly, no masses. EXTREMITIES: No edema, no swelling in extremities. MUSCULOSKELETAL: No gross abnormalities. NEUROLOGICAL: Alert and oriented x3,  cranial nerves II through XII intact. PSYCHIATRIC: Normal mood, good eye contact.       Assessment & Plan:  Type 2 diabetes mellitus without complication, without long-term current use of insulin (HCC)  Essential hypertension  Gastroesophageal reflux disease without esophagitis  Mixed hyperlipidemia  Cocaine dependence, in remission (HCC)    Assessment and Plan Assessment & Plan Chronic neck, shoulder, and back pain with muscle spasm   He experiences chronic pain in the neck, shoulder, and back with muscle spasms, worsened by physical activity. Pain radiates to the left arm, affecting sleep. Methocarbamol  500 mg twice daily provides relief. There is no weakness or significant functional impairment. Physical therapy and massage therapy are discussed as adjunctive treatments, with consideration of a TENS unit for pain management. Methocarbamol  500 mg, 60 tablets with refills, is prescribed. He is encouraged to continue acetaminophen  500 mg three times daily as needed. Physical therapy is recommended for targeted exercises and stretches. [facility]. Use of heat and ice for symptomatic relief is encouraged, along with self-massage techniques and exercises to relieve muscle tension.  Type 2 diabetes mellitus   His diabetes is managed with diet and exercise, with no current medication required. He should continue current management with diet and exercise.  Essential hypertension   His hypertension is well-controlled with amlodipine  and lisinopril HCT. Blood pressure readings have improved with the current regimen, and he reports no symptoms of headache, dizziness, or chest pain. The amlodipine  prescription is renewed, and he should continue the current antihypertensive regimen.  Mixed hyperlipidemia   His hyperlipidemia is managed with atorvastatin 10 mg daily. He should continue atorvastatin 10 mg daily.  Gastroesophageal  reflux disease   His GERD is well-controlled with daily omeprazole, with no breakthrough symptoms reported. He should continue omeprazole daily.  Benign prostatic hyperplasia with lower urinary tract symptoms   His benign prostatic hyperplasia is managed with tamsulosin , and symptoms are well-controlled with the current regimen. He should continue tamsulosin  as prescribed.  Erectile dysfunction   His erectile dysfunction is managed with tadalafil  as needed, and the current prescription is effective. The tadalafil  prescription is renewed.  Tobacco use disorder   He continues to use tobacco despite previous cessation attempts. He acknowledges difficulty in quitting but expresses intent to quit in the future. Smoking cessation efforts are encouraged.  General health maintenance   Routine health maintenance is discussed. Lung cancer screening is due this year, and blood work results are needed to monitor overall health status. He should ensure lung cancer screening is completed this year and obtain and review recent blood work results from the [facility].     No follow-ups on file.  Jenkins CHRISTELLA Carrel, MD     [1]  Current Outpatient Medications:    acetaminophen  (TYLENOL ) 500 MG tablet, Take 1 tablet by mouth 3 (three) times daily as needed., Disp: , Rfl:  amLODipine  (NORVASC ) 10 MG tablet, Take 1 tablet (10 mg total) by mouth daily., Disp: 90 tablet, Rfl: 1   aspirin 81 MG chewable tablet, Chew 81 mg by mouth., Disp: , Rfl:    atorvastatin (LIPITOR) 10 MG tablet, TAKE ONE TABLET BY MOUTH AT BEDTIME FOR CHOLESTEROL, Disp: , Rfl:    cyanocobalamin  (VITAMIN B12) 500 MCG tablet, Take 1,000 mcg by mouth daily., Disp: , Rfl:    fluticasone (FLONASE) 50 MCG/ACT nasal spray, INSTILL 1 SPRAY IN EACH NOSTRIL AT BEDTIME, Disp: , Rfl:    ketoconazole  (NIZORAL ) 2 % cream, APPLY TO AFFECTED AREA TWICE A DAY, Disp: 60 g, Rfl: 0   lidocaine  (LIDODERM ) 5 %, Place 1 patch onto the skin daily. Remove & Discard patch  within 12 hours or as directed by MD, Disp: 14 patch, Rfl: 0   lisinopril-hydrochlorothiazide (ZESTORETIC) 20-12.5 MG tablet, Take 1 tablet by mouth 2 (two) times daily., Disp: , Rfl:    loratadine (CLARITIN) 10 MG tablet, Take 1 tablet by mouth daily., Disp: , Rfl:    meclizine  (ANTIVERT ) 25 MG tablet, Take 1 tablet (25 mg total) by mouth 3 (three) times daily as needed for dizziness., Disp: 20 tablet, Rfl: 0   methocarbamol  (ROBAXIN ) 500 MG tablet, Take 1 tablet (500 mg total) by mouth 2 (two) times daily as needed for muscle spasms., Disp: 10 tablet, Rfl: 0   nortriptyline  (PAMELOR ) 75 MG capsule, TAKE ONE CAPSULE BY MOUTH AT BEDTIME AS NEEDED (DOSE INCREASE), Disp: , Rfl:    omeprazole (PRILOSEC) 40 MG capsule, Take by mouth., Disp: , Rfl:    simethicone (MYLICON) 80 MG chewable tablet, CHEW TWO TABLETS BY MOUTH DAILY, Disp: , Rfl:    tadalafil  (CIALIS ) 20 MG tablet, Take 0.5-1 tablets (10-20 mg total) by mouth every other day as needed for erectile dysfunction., Disp: 10 tablet, Rfl: 3   tamsulosin  (FLOMAX ) 0.4 MG CAPS capsule, Take 1 capsule by mouth at bedtime., Disp: , Rfl:    triamcinolone  ointment (KENALOG ) 0.5 %, Apply 1 Application topically 2 (two) times daily., Disp: 30 g, Rfl: 0   urea (CARMOL) 20 % cream, Apply topically., Disp: , Rfl:  [2] No Known Allergies  "

## 2024-10-05 NOTE — Progress Notes (Signed)
 [facility]inal[facility] LITTIE Seip Sr.                                          MRN: [id number]   10/05/2024   The [facility] Quality Team Specialist reviewed this patient medical record for the purposes of chart review for care gap closure. The following were reviewed: chart review for care gap closure-kidney health evaluation for diabetes:eGFR  and uACR.    [facility] Quality Team

## 2025-03-23 ENCOUNTER — Ambulatory Visit: Admitting: Family Medicine

## 2025-08-14 ENCOUNTER — Ambulatory Visit
